# Patient Record
Sex: Male | Born: 1960 | Race: White | Hispanic: No | Marital: Married | Smoking: Never smoker
Health system: Southern US, Community
[De-identification: ages and names within clinical notes are randomized; demographics above are authoritative.]

## PROBLEM LIST (undated history)

## (undated) DIAGNOSIS — H9190 Unspecified hearing loss, unspecified ear: Secondary | ICD-10-CM

## (undated) DIAGNOSIS — I10 Essential (primary) hypertension: Secondary | ICD-10-CM

## (undated) DIAGNOSIS — E119 Type 2 diabetes mellitus without complications: Secondary | ICD-10-CM

## (undated) DIAGNOSIS — N183 Chronic kidney disease, stage 3 (moderate): Secondary | ICD-10-CM

## (undated) DIAGNOSIS — E8841 MELAS syndrome: Principal | ICD-10-CM

## (undated) DIAGNOSIS — I739 Peripheral vascular disease, unspecified: Secondary | ICD-10-CM

## (undated) HISTORY — PX: NASAL SINUS SURGERY: SHX719

## (undated) HISTORY — DX: Type 2 diabetes mellitus without complications: E11.9

## (undated) HISTORY — DX: MELAS syndrome: E88.41

## (undated) HISTORY — DX: Unspecified hearing loss, unspecified ear: H91.90

## (undated) HISTORY — DX: Essential (primary) hypertension: I10

## (undated) HISTORY — DX: Peripheral vascular disease, unspecified: I73.9

---

## 2018-02-23 ENCOUNTER — Encounter: Payer: Self-pay | Admitting: *Deleted

## 2018-02-23 ENCOUNTER — Encounter: Payer: Self-pay | Admitting: Cardiology

## 2018-02-23 DIAGNOSIS — E119 Type 2 diabetes mellitus without complications: Secondary | ICD-10-CM

## 2018-02-23 DIAGNOSIS — E8841 MELAS syndrome: Secondary | ICD-10-CM

## 2018-02-23 DIAGNOSIS — H9190 Unspecified hearing loss, unspecified ear: Secondary | ICD-10-CM

## 2018-02-23 DIAGNOSIS — I739 Peripheral vascular disease, unspecified: Secondary | ICD-10-CM

## 2018-02-23 DIAGNOSIS — I10 Essential (primary) hypertension: Secondary | ICD-10-CM

## 2018-02-23 HISTORY — DX: Type 2 diabetes mellitus without complications: E11.9

## 2018-02-23 HISTORY — DX: Peripheral vascular disease, unspecified: I73.9

## 2018-02-23 HISTORY — DX: MELAS syndrome: E88.41

## 2018-02-23 HISTORY — DX: Unspecified hearing loss, unspecified ear: H91.90

## 2018-02-23 HISTORY — DX: Essential (primary) hypertension: I10

## 2018-03-09 ENCOUNTER — Ambulatory Visit (INDEPENDENT_AMBULATORY_CARE_PROVIDER_SITE_OTHER): Payer: Self-pay | Admitting: Cardiology

## 2018-03-09 ENCOUNTER — Encounter: Payer: Self-pay | Admitting: Cardiology

## 2018-03-09 VITALS — BP 130/82 | HR 89 | Ht 66.5 in | Wt 133.8 lb

## 2018-03-09 DIAGNOSIS — E088 Diabetes mellitus due to underlying condition with unspecified complications: Secondary | ICD-10-CM

## 2018-03-09 DIAGNOSIS — I10 Essential (primary) hypertension: Secondary | ICD-10-CM

## 2018-03-09 DIAGNOSIS — I209 Angina pectoris, unspecified: Secondary | ICD-10-CM

## 2018-03-09 MED ORDER — ASPIRIN EC 325 MG PO TBEC: 325.0000 mg | DELAYED_RELEASE_TABLET | Freq: Every day | ORAL | 3 refills | Status: DC

## 2018-03-09 MED ORDER — NITROGLYCERIN 0.4 MG SL SUBL: 0.4000 mg | SUBLINGUAL_TABLET | SUBLINGUAL | 11 refills | Status: DC | PRN

## 2018-03-09 NOTE — Patient Instructions (Addendum)
Medication Instructions:  Your physician has recommended you make the following change in your medication:  START enteric coated aspirin 325 mg daily START Nitroglycerin 0.4 mg sublingual (under your tongue) as needed for chest pain. If experiencing chest pain, stop what you are doing and sit down. Take 1 nitroglycerin and wait 5 minutes. If chest pain continues, take another nitroglycerin and wait 5 minutes. If chest pain does not subside, take 1 more nitroglycerin and dial 911. You make take a total of 3 nitroglycerin in a 15 minute time frame.  Labwork: Your physician recommends that you have the following labs drawn: CBC and BMP  Testing/Procedures:   [facility] CARDIOVASCULAR DIVISION [facility] [place] [address] [place] [address] Dept: [phone] Loc: [phone]  Daniel Malone  03/09/2018  You are scheduled for a Cardiac Catheterization on Wednesday, May 22 with Dr. Lance Muss.  1. Please arrive at the [facility]Main Entrance [facility] at [facility]: [address] [place], [place] [address] at 9:30 AM (two hours before your procedure to ensure your preparation). Free valet parking service is available.   Special note: Every effort is made to have your procedure done on time. Please understand that emergencies sometimes delay scheduled procedures.  2. Diet: You may have a clear liquid breakfast the morning of the heart cath until 5 AM  3. Labs: Done today.  4. Medication instructions in preparation for your procedure:  Please hold your januvia, glipizide, and actos the morning of the heart cath.  On the morning of your procedure, take your Aspirin and any morning medicines NOT listed above.  You may use sips of water.  5. Plan for one night stay--bring personal belongings. 6. Bring a current list of your medications and current insurance cards. 7. You MUST have a responsible person to drive you  home. 8. Someone MUST be with you the first 24 hours after you arrive home or your discharge will be delayed. 9. Please wear clothes that are easy to get on and off and wear slip-on shoes.  Thank you for allowing [place] to care for you!   -- [facility]   Follow-Up: Your physician recommends that you schedule a follow-up appointment in: 1 month  Any Other Special Instructions Will Be Listed Below (If Applicable).     If you need a refill on your cardiac medications before your next appointment, please call your pharmacy.   [facility]  Garey Ham, RN, BSN    Aspirin and Your Heart Aspirin is a medicine that affects the way blood clots. Aspirin can be used to help reduce the risk of blood clots, heart attacks, and other heart-related problems. Should I take aspirin? Your health care provider will help you determine whether it is safe and beneficial for you to take aspirin daily. Taking aspirin daily may be beneficial if you:  Have had a heart attack or chest pain.  Have undergone open heart surgery such as coronary artery bypass surgery (CABG).  Have had coronary angioplasty.  Have experienced a stroke or transient ischemic attack (TIA).  Have peripheral vascular disease (PVD).  Have chronic heart rhythm problems such as atrial fibrillation.  Are there any risks of taking aspirin daily? Daily use of aspirin can increase your risk of side effects. Some of these include:  Bleeding. Bleeding problems can be minor or serious. An example of a minor problem is a cut that does  not stop bleeding. An example of a more serious problem is stomach bleeding or bleeding into the brain. Your risk of bleeding is increased if you are also taking non-steroidal anti-inflammatory medicine (NSAIDs).  Increased bruising.  Upset stomach.  An allergic reaction. People who have nasal polyps have an increased risk of developing an aspirin allergy.  What are  some guidelines I should follow when taking aspirin?  Take aspirin only as directed by your health care provider. Make sure you understand how much you should take and what form you should take. The two forms of aspirin are: ? Non-enteric-coated. This type of aspirin does not have a coating and is absorbed quickly. Non-enteric-coated aspirin is usually recommended for people with chest pain. This type of aspirin also comes in a chewable form. ? Enteric-coated. This type of aspirin has a special coating that releases the medicine very slowly. Enteric-coated aspirin causes less stomach upset than non-enteric-coated aspirin. This type of aspirin should not be chewed or crushed.  Drink alcohol in moderation. Drinking alcohol increases your risk of bleeding. When should I seek medical care?  You have unusual bleeding or bruising.  You have stomach pain.  You have an allergic reaction. Symptoms of an allergic reaction include: ? Hives. ? Itchy skin. ? Swelling of the lips, tongue, or face.  You have ringing in your ears. When should I seek immediate medical care?  Your bowel movements are bloody, dark red, or black in color.  You vomit or cough up blood.  You have blood in your urine.  You cough, wheeze, or feel short of breath. If you have any of the following symptoms, this is an emergency. Do not wait to see if the pain will go away. Get medical help at once. Call your local emergency services (911 in the [place] Do not drive yourself to the hospital.  You have severe chest pain, especially if the pain is crushing or pressure-like and spreads to the arms, back, neck, or jaw.  You have stroke-like symptoms, such as: ? Loss of vision. ? Difficulty talking. ? Numbness or weakness on one side of your body. ? Numbness or weakness in your arm or leg. ? Not thinking clearly or feeling confused.  This information is not intended to replace advice given to you by your health care provider.  Make sure you discuss any questions you have with your health care provider. Document Released: 09/18/2008 Document Revised: 02/13/2016 Document Reviewed: 01/11/2014 Elsevier Interactive Patient Education  2018 [employer].   Coronary Angiogram With Stent Coronary angiogram with stent placement is a procedure to widen or open a narrow blood vessel of the heart (coronary artery). Arteries may become blocked by cholesterol buildup (plaques) in the lining of the wall. When a coronary artery becomes partially blocked, blood flow to that area decreases. This may lead to chest pain or a heart attack (myocardial infarction). A stent is a small piece of metal that looks like mesh or a spring. Stent placement may be done as treatment for a heart attack or right after a coronary angiogram in which a blocked artery is found. Let your health care provider know about:  Any allergies you have.  All medicines you are taking, including vitamins, herbs, eye drops, creams, and over-the-counter medicines.  Any problems you or family members have had with anesthetic medicines.  Any blood disorders you have.  Any surgeries you have had.  Any medical conditions you have.  Whether you are pregnant or may be  pregnant. What are the risks? Generally, this is a safe procedure. However, problems may occur, including:  Damage to the heart or its blood vessels.  A return of blockage.  Bleeding, infection, or bruising at the insertion site.  A collection of blood under the skin (hematoma) at the insertion site.  A blood clot in another part of the body.  Kidney injury.  Allergic reaction to the dye or contrast that is used.  Bleeding into the abdomen (retroperitoneal bleeding).  What happens before the procedure? Staying hydrated Follow instructions from your health care provider about hydration, which may include:  Up to 2 hours before the procedure - you may continue to drink clear liquids, such  as water, clear fruit juice, black coffee, and plain tea.  Eating and drinking restrictions Follow instructions from your health care provider about eating and drinking, which may include:  8 hours before the procedure - stop eating heavy meals or foods such as meat, fried foods, or fatty foods.  6 hours before the procedure - stop eating light meals or foods, such as toast or cereal.  2 hours before the procedure - stop drinking clear liquids.  Ask your health care provider about:  Changing or stopping your regular medicines. This is especially important if you are taking diabetes medicines or blood thinners.  Taking medicines such as ibuprofen. These medicines can thin your blood. Do not take these medicines before your procedure if your health care provider instructs you not to. Generally, aspirin is recommended before a procedure of passing a small, thin tube (catheter) through a blood vessel and into the heart (cardiac catheterization).  What happens during the procedure?  An IV tube will be inserted into one of your veins.  You will be given one or more of the following: ? A medicine to help you relax (sedative). ? A medicine to numb the area where the catheter will be inserted into an artery (local anesthetic).  To reduce your risk of infection: ? Your health care team will wash or sanitize their hands. ? Your skin will be washed with soap. ? Hair may be removed from the area where the catheter will be inserted.  Using a guide wire, the catheter will be inserted into an artery. The location may be in your groin, in your wrist, or in the fold of your arm (near your elbow).  A type of X-ray (fluoroscopy) will be used to help guide the catheter to the opening of the arteries in the heart.  A dye will be injected into the catheter, and X-rays will be taken. The dye will help to show where any narrowing or blockages are located in the arteries.  A tiny wire will be guided to the  blocked spot, and a balloon will be inflated to make the artery wider.  The stent will be expanded and will crush the plaques into the wall of the vessel. The stent will hold the area open and improve the blood flow. Most stents have a drug coating to reduce the risk of the stent narrowing over time.  The artery may be made wider using a drill, laser, or other tools to remove plaques.  When the blood flow is better, the catheter will be removed. The lining of the artery will grow over the stent, which stays where it was placed. This procedure may vary among health care providers and hospitals. What happens after the procedure?  If the procedure is done through the leg, you will  be kept in bed lying flat for about 6 hours. You will be instructed to not bend and not cross your legs.  The insertion site will be checked frequently.  The pulse in your foot or wrist will be checked frequently.  You may have additional blood tests, X-rays, and a test that records the electrical activity of your heart (electrocardiogram, or ECG). This information is not intended to replace advice given to you by your health care provider. Make sure you discuss any questions you have with your health care provider. Document Released: 04/12/2003 Document Revised: 06/05/2016 Document Reviewed: 05/11/2016 Elsevier Interactive Patient Education  2018 [employer]. Nitroglycerin sublingual tablets What is this medicine? NITROGLYCERIN (nye troe GLI ser in) is a type of vasodilator. It relaxes blood vessels, increasing the blood and oxygen supply to your heart. This medicine is used to relieve chest pain caused by angina. It is also used to prevent chest pain before activities like climbing stairs, going outdoors in cold weather, or sexual activity. This medicine may be used for other purposes; ask your health care provider or pharmacist if you have questions. COMMON BRAND NAME(S): Nitroquick, Nitrostat, Nitrotab What  should I tell my health care provider before I take this medicine? They need to know if you have any of these conditions: -anemia -head injury, recent stroke, or bleeding in the brain -liver disease -previous heart attack -an unusual or allergic reaction to nitroglycerin, other medicines, foods, dyes, or preservatives -pregnant or trying to get pregnant -breast-feeding How should I use this medicine? Take this medicine by mouth as needed. At the first sign of an angina attack (chest pain or tightness) place one tablet under your tongue. You can also take this medicine 5 to 10 minutes before an event likely to produce chest pain. Follow the directions on the prescription label. Let the tablet dissolve under the tongue. Do not swallow whole. Replace the dose if you accidentally swallow it. It will help if your mouth is not dry. Saliva around the tablet will help it to dissolve more quickly. Do not eat or drink, smoke or chew tobacco while a tablet is dissolving. If you are not better within 5 minutes after taking ONE dose of nitroglycerin, call 9-1-1 immediately to seek emergency medical care. Do not take more than 3 nitroglycerin tablets over 15 minutes. If you take this medicine often to relieve symptoms of angina, your doctor or health care professional may provide you with different instructions to manage your symptoms. If symptoms do not go away after following these instructions, it is important to call 9-1-1 immediately. Do not take more than 3 nitroglycerin tablets over 15 minutes. Talk to your pediatrician regarding the use of this medicine in children. Special care may be needed. Overdosage: If you think you have taken too much of this medicine contact a poison control center or emergency room at once. NOTE: This medicine is only for you. Do not share this medicine with others. What if I miss a dose? This does not apply. This medicine is only used as needed. What may interact with this  medicine? Do not take this medicine with any of the following medications: -certain migraine medicines like ergotamine and dihydroergotamine (DHE) -medicines used to treat erectile dysfunction like sildenafil, tadalafil, and vardenafil -riociguat This medicine may also interact with the following medications: -alteplase -aspirin -heparin -medicines for high blood pressure -medicines for mental depression -other medicines used to treat angina -phenothiazines like chlorpromazine, mesoridazine, prochlorperazine, thioridazine This list may not  describe all possible interactions. Give your health care provider a list of all the medicines, herbs, non-prescription drugs, or dietary supplements you use. Also tell them if you smoke, drink alcohol, or use illegal drugs. Some items may interact with your medicine. What should I watch for while using this medicine? Tell your doctor or health care professional if you feel your medicine is no longer working. Keep this medicine with you at all times. Sit or lie down when you take your medicine to prevent falling if you feel dizzy or faint after using it. Try to remain calm. This will help you to feel better faster. If you feel dizzy, take several deep breaths and lie down with your feet propped up, or bend forward with your head resting between your knees. You may get drowsy or dizzy. Do not drive, use machinery, or do anything that needs mental alertness until you know how this drug affects you. Do not stand or sit up quickly, especially if you are an older patient. This reduces the risk of dizzy or fainting spells. Alcohol can make you more drowsy and dizzy. Avoid alcoholic drinks. Do not treat yourself for coughs, colds, or pain while you are taking this medicine without asking your doctor or health care professional for advice. Some ingredients may increase your blood pressure. What side effects may I notice from receiving this medicine? Side effects that  you should report to your doctor or health care professional as soon as possible: -blurred vision -dry mouth -skin rash -sweating -the feeling of extreme pressure in the head -unusually weak or tired Side effects that usually do not require medical attention (report to your doctor or health care professional if they continue or are bothersome): -flushing of the face or neck -headache -irregular heartbeat, palpitations -nausea, vomiting This list may not describe all possible side effects. Call your doctor for medical advice about side effects. You may report side effects to FDA at 1-800-FDA-1088. Where should I keep my medicine? Keep out of the reach of children. Store at room temperature between 20 and 25 degrees C (68 and 77 degrees F). Store in Retail buyer. Protect from light and moisture. Keep tightly closed. Throw away any unused medicine after the expiration date. NOTE: This sheet is a summary. It may not cover all possible information. If you have questions about this medicine, talk to your doctor, pharmacist, or health care provider.  2018 Elsevier/Gold Standard (2013-08-04 17:57:36)

## 2018-03-09 NOTE — Addendum Note (Signed)
Addended by: Craige Cotta on: 03/09/2018 09:38 AM   Modules accepted: Orders

## 2018-03-09 NOTE — H&P (View-Only) (Signed)
Cardiology Office Note:    Date:  03/09/2018   ID:  Daniel Malone, DOB 07/11/1961, MRN [id number]  PCP:  [name], [name], MD  Cardiologist:  Leasha Goldberger R Stephaun Million, MD   Referring MD: [name], [name], MD    ASSESSMENT:    1. Angina pectoris (HCC)   2. Essential hypertension   3. Diabetes mellitus due to underlying condition with complication, without long-term current use of insulin (HCC)    PLAN:    In order of problems listed above:  1. Primary prevention stressed with the patient.  Importance of compliance with diet and medications stressed and he vocalized understanding.  Diet was discussed for diabetes mellitus. 2. Sublingual nitroglycerin prescription was sent, its protocol and 911 protocol explained and the patient vocalized understanding questions were answered to the patient's satisfaction 3. He chews tobacco and I told him to be abstinent from any form of tobacco abuse and he vocalized understanding. 4. The patient's chest tightness symptoms are concerning and this is in addition to the fact that he has significant risk factors for coronary artery disease including diabetes mellitus.  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 5. In view of the patient's symptoms, I discussed with the patient options for evaluation. Invasive and noninvasive options were given to the patient. I discussed stress testing and coronary angiography and left heart catheterization at length. Benefits, pros and cons of each approach were discussed at length. Patient had multiple questions which were answered to the patient's satisfaction. Patient opted for invasive evaluation and we will set up for coronary angiography and left heart catheterization. Further recommendations will be made based on the findings with coronary angiography. In the interim if the patient has any significant symptoms in hospital to the nearest emergency room. 6. The patient was advised to take  full-strength coated aspirin on a daily basis. 7. He needs to be on statin therapy to as he is a diabetic and also with this coronary evaluation will help us manage his risks aggressively.  We will address this after the coronary angiography.   Medication Adjustments/Labs and Tests Ordered: Current medicines are reviewed at length with the patient today.  Concerns regarding medicines are outlined above.  No orders of the defined types were placed in this encounter.  No orders of the defined types were placed in this encounter.    History of Present Illness:    Daniel Malone is a 57 y.o. male who is being seen today for the evaluation of chest tightness at the request of [name], [name], MD.  Patient is a pleasant 57-year-old male.  He has past medical history of essential hypertension and diabetes mellitus.  He mentions to me that in the recent past several weeks he is noticing chest tightness upon exertion and this occurs consistently with exercise.  He does not exercise on a regular basis but his work involves physical activity.  He has noticed these chest tightness going to the neck into the arm also consistently with exertion.  No orthopnea PND or any syncope.  This is also accompanied by shortness of breath.  The patient is very concerned about the symptoms and is here for an evaluation.  At the time of my evaluation, the patient is alert awake oriented and in no distress.  Past Medical History:  Diagnosis Date  . Diabetes mellitus, type 2 (HCC) 02/23/2018  . Hearing loss 02/23/2018  . Hypertension 02/23/2018  . MELAS syndrome (HCC) 02/23/2018  .   Peripheral vascular disease (HCC) 02/23/2018    Past Surgical History:  Procedure Laterality Date  . NASAL SINUS SURGERY      Current Medications: Current Meds  Medication Sig  . glipiZIDE (GLUCOTROL XL) 10 MG 24 hr tablet Take 10 mg by mouth 2 (two) times daily.  Marland Kitchen lisinopril (PRINIVIL,ZESTRIL) 10 MG tablet Take 10 mg by mouth daily.  .  pioglitazone (ACTOS) 30 MG tablet Take 30 mg by mouth daily.  . sitaGLIPtin (JANUVIA) 100 MG tablet Take 100 mg by mouth daily.     Allergies:   Patient has no known allergies.   Social History   Socioeconomic History  . Marital status: Married    Spouse name: Not on file  . Number of children: 1  . Years of education: Not on file  . Highest education level: Not on file  Occupational History  . Not on file  Social Needs  . Financial resource strain: Not on file  . Food insecurity:    Worry: Not on file    Inability: Not on file  . Transportation needs:    Medical: Not on file    Non-medical: Not on file  Tobacco Use  . Smoking status: Never Smoker  . Smokeless tobacco: Current User    Types: Chew  Substance and Sexual Activity  . Alcohol use: Not Currently  . Drug use: Not Currently  . Sexual activity: Not on file  Lifestyle  . Physical activity:    Days per week: Not on file    Minutes per session: Not on file  . Stress: Not on file  Relationships  . Social connections:    Talks on phone: Not on file    Gets together: Not on file    Attends religious service: Not on file    Active member of club or organization: Not on file    Attends meetings of clubs or organizations: Not on file    Relationship status: Not on file  Other Topics Concern  . Not on file  Social History Narrative  . Not on file     Family History: The patient's family history includes CAD in his father; Diabetes in his mother.  ROS:   Please see the history of present illness.    All other systems reviewed and are negative.  EKGs/Labs/Other Studies Reviewed:    The following studies were reviewed today: EKG reveals sinus rhythm and nonspecific ST-T changes.   Recent Labs: No results found for requested labs within last 8760 hours.  Recent Lipid Panel No results found for: CHOL, TRIG, HDL, CHOLHDL, VLDL, LDLCALC, LDLDIRECT  Physical Exam:    VS:  BP 130/82 (BP Location: Left Arm,  Patient Position: Sitting, Cuff Size: Normal)   Pulse 89   Ht 5' 6.5" (1.689 m)   Wt 133 lb 12.8 oz (60.7 kg)   SpO2 99%   BMI 21.27 kg/m     Wt Readings from Last 3 Encounters:  03/09/18 133 lb 12.8 oz (60.7 kg)  02/23/18 133 lb (60.3 kg)     GEN: Patient is in no acute distress HEENT: Normal NECK: No JVD; No carotid bruits LYMPHATICS: No lymphadenopathy CARDIAC: S1 S2 regular, 2/6 systolic murmur at the apex. RESPIRATORY:  Clear to auscultation without rales, wheezing or rhonchi  ABDOMEN: Soft, non-tender, non-distended MUSCULOSKELETAL:  No edema; No deformity  SKIN: Warm and dry NEUROLOGIC:  Alert and oriented x 3 PSYCHIATRIC:  Normal affect    Signed, Garwin Brothers, MD  03/09/2018  9:[place]

## 2018-03-09 NOTE — Addendum Note (Signed)
Addended by: Craige Cotta on: 03/09/2018 09:37 AM   Modules accepted: Orders

## 2018-03-09 NOTE — Progress Notes (Signed)
Cardiology Office Note:    Date:  03/09/2018   ID:  Daniel Malone, DOB Apr 10, 1961, MRN [id number]  PCP:  Nonnie Done., MD  Cardiologist:  Garwin Brothers, MD   Referring MD: Nonnie Done., MD    ASSESSMENT:    1. Angina pectoris (HCC)   2. Essential hypertension   3. Diabetes mellitus due to underlying condition with complication, without long-term current use of insulin (HCC)    PLAN:    In order of problems listed above:  1. Primary prevention stressed with the patient.  Importance of compliance with diet and medications stressed and he vocalized understanding.  Diet was discussed for diabetes mellitus. 2. Sublingual nitroglycerin prescription was sent, its protocol and 911 protocol explained and the patient vocalized understanding questions were answered to the patient's satisfaction 3. He chews tobacco and I told him to be abstinent from any form of tobacco abuse and he vocalized understanding. 4. The patient's chest tightness symptoms are concerning and this is in addition to the fact that he has significant risk factors for coronary artery disease including diabetes mellitus.  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 5. In view of the patient's symptoms, I discussed with the patient options for evaluation. Invasive and noninvasive options were given to the patient. I discussed stress testing and coronary angiography and left heart catheterization at length. Benefits, pros and cons of each approach were discussed at length. Patient had multiple questions which were answered to the patient's satisfaction. Patient opted for invasive evaluation and we will set up for coronary angiography and left heart catheterization. Further recommendations will be made based on the findings with coronary angiography. In the interim if the patient has any significant symptoms in hospital to the nearest emergency room. 6. The patient was advised to take  full-strength coated aspirin on a daily basis. 7. He needs to be on statin therapy to as he is a diabetic and also with this coronary evaluation will help [place] manage his risks aggressively.  We will address this after the coronary angiography.   Medication Adjustments/Labs and Tests Ordered: Current medicines are reviewed at length with the patient today.  Concerns regarding medicines are outlined above.  No orders of the defined types were placed in this encounter.  No orders of the defined types were placed in this encounter.    History of Present Illness:    Daniel Malone is a 57 y.o. male who is being seen today for the evaluation of chest tightness at the request of [name], Excell Seltzer., MD.  Patient is a pleasant 57 year old male.  He has past medical history of essential hypertension and diabetes mellitus.  He mentions to me that in the recent past several weeks he is noticing chest tightness upon exertion and this occurs consistently with exercise.  He does not exercise on a regular basis but his work involves physical activity.  He has noticed these chest tightness going to the neck into the arm also consistently with exertion.  No orthopnea PND or any syncope.  This is also accompanied by shortness of breath.  The patient is very concerned about the symptoms and is here for an evaluation.  At the time of my evaluation, the patient is alert awake oriented and in no distress.  Past Medical History:  Diagnosis Date  . Diabetes mellitus, type 2 (HCC) 02/23/2018  . Hearing loss 02/23/2018  . Hypertension 02/23/2018  . MELAS syndrome (HCC) 02/23/2018  .  Peripheral vascular disease (HCC) 02/23/2018    Past Surgical History:  Procedure Laterality Date  . NASAL SINUS SURGERY      Current Medications: Current Meds  Medication Sig  . glipiZIDE (GLUCOTROL XL) 10 MG 24 hr tablet Take 10 mg by mouth 2 (two) times daily.  Marland Kitchen lisinopril (PRINIVIL,ZESTRIL) 10 MG tablet Take 10 mg by mouth daily.  .  pioglitazone (ACTOS) 30 MG tablet Take 30 mg by mouth daily.  . sitaGLIPtin (JANUVIA) 100 MG tablet Take 100 mg by mouth daily.     Allergies:   Patient has no known allergies.   Social History   Socioeconomic History  . Marital status: Married    Spouse name: Not on file  . Number of children: 1  . Years of education: Not on file  . Highest education level: Not on file  Occupational History  . Not on file  Social Needs  . Financial resource strain: Not on file  . Food insecurity:    Worry: Not on file    Inability: Not on file  . Transportation needs:    Medical: Not on file    Non-medical: Not on file  Tobacco Use  . Smoking status: Never Smoker  . Smokeless tobacco: Current User    Types: Chew  Substance and Sexual Activity  . Alcohol use: Not Currently  . Drug use: Not Currently  . Sexual activity: Not on file  Lifestyle  . Physical activity:    Days per week: Not on file    Minutes per session: Not on file  . Stress: Not on file  Relationships  . Social connections:    Talks on phone: Not on file    Gets together: Not on file    Attends religious service: Not on file    Active member of club or organization: Not on file    Attends meetings of clubs or organizations: Not on file    Relationship status: Not on file  Other Topics Concern  . Not on file  Social History Narrative  . Not on file     Family History: The patient's family history includes CAD in his father; Diabetes in his mother.  ROS:   Please see the history of present illness.    All other systems reviewed and are negative.  EKGs/Labs/Other Studies Reviewed:    The following studies were reviewed today: EKG reveals sinus rhythm and nonspecific ST-T changes.   Recent Labs: No results found for requested labs within last 8760 hours.  Recent Lipid Panel No results found for: CHOL, TRIG, HDL, CHOLHDL, VLDL, LDLCALC, LDLDIRECT  Physical Exam:    VS:  BP 130/82 (BP Location: Left Arm,  Patient Position: Sitting, Cuff Size: Normal)   Pulse 89   Ht 5' 6.5" (1.689 m)   Wt 133 lb 12.8 oz (60.7 kg)   SpO2 99%   BMI 21.27 kg/m     Wt Readings from Last 3 Encounters:  03/09/18 133 lb 12.8 oz (60.7 kg)  02/23/18 133 lb (60.3 kg)     GEN: Patient is in no acute distress HEENT: Normal NECK: No JVD; No carotid bruits LYMPHATICS: No lymphadenopathy CARDIAC: S1 S2 regular, 2/6 systolic murmur at the apex. RESPIRATORY:  Clear to auscultation without rales, wheezing or rhonchi  ABDOMEN: Soft, non-tender, non-distended MUSCULOSKELETAL:  No edema; No deformity  SKIN: Warm and dry NEUROLOGIC:  Alert and oriented x 3 PSYCHIATRIC:  Normal affect    Signed, Garwin Brothers, MD  03/09/2018  9:[place]

## 2018-03-10 ENCOUNTER — Encounter (HOSPITAL_COMMUNITY)
Admission: RE | Disposition: A | Discharge status: Home / Self Care | Payer: Self-pay | Source: Ambulatory Visit | Attending: Cardiothoracic Surgery

## 2018-03-10 ENCOUNTER — Inpatient Hospital Stay (HOSPITAL_COMMUNITY): Payer: Self-pay

## 2018-03-10 ENCOUNTER — Other Ambulatory Visit: Payer: Self-pay | Admitting: *Deleted

## 2018-03-10 ENCOUNTER — Inpatient Hospital Stay (HOSPITAL_COMMUNITY)
Admission: RE | Admit: 2018-03-10 | Discharge: 2018-03-17 | DRG: 234 | Disposition: A | Discharge status: Home / Self Care | Payer: Self-pay | Source: Ambulatory Visit | Attending: Cardiothoracic Surgery | Admitting: Cardiothoracic Surgery

## 2018-03-10 ENCOUNTER — Encounter (HOSPITAL_COMMUNITY): Payer: Self-pay | Admitting: Cardiothoracic Surgery

## 2018-03-10 ENCOUNTER — Other Ambulatory Visit: Payer: Self-pay

## 2018-03-10 DIAGNOSIS — Z833 Family history of diabetes mellitus: Secondary | ICD-10-CM

## 2018-03-10 DIAGNOSIS — Z7984 Long term (current) use of oral hypoglycemic drugs: Secondary | ICD-10-CM

## 2018-03-10 DIAGNOSIS — Z01810 Encounter for preprocedural cardiovascular examination: Secondary | ICD-10-CM

## 2018-03-10 DIAGNOSIS — H9190 Unspecified hearing loss, unspecified ear: Secondary | ICD-10-CM | POA: Diagnosis present

## 2018-03-10 DIAGNOSIS — I129 Hypertensive chronic kidney disease with stage 1 through stage 4 chronic kidney disease, or unspecified chronic kidney disease: Secondary | ICD-10-CM | POA: Diagnosis present

## 2018-03-10 DIAGNOSIS — I493 Ventricular premature depolarization: Secondary | ICD-10-CM | POA: Diagnosis not present

## 2018-03-10 DIAGNOSIS — E119 Type 2 diabetes mellitus without complications: Secondary | ICD-10-CM

## 2018-03-10 DIAGNOSIS — E088 Diabetes mellitus due to underlying condition with unspecified complications: Secondary | ICD-10-CM

## 2018-03-10 DIAGNOSIS — I2510 Atherosclerotic heart disease of native coronary artery without angina pectoris: Secondary | ICD-10-CM

## 2018-03-10 DIAGNOSIS — E8841 MELAS syndrome: Secondary | ICD-10-CM | POA: Diagnosis present

## 2018-03-10 DIAGNOSIS — R000 Tachycardia, unspecified: Secondary | ICD-10-CM | POA: Diagnosis present

## 2018-03-10 DIAGNOSIS — E875 Hyperkalemia: Secondary | ICD-10-CM | POA: Diagnosis present

## 2018-03-10 DIAGNOSIS — Z951 Presence of aortocoronary bypass graft: Secondary | ICD-10-CM

## 2018-03-10 DIAGNOSIS — R011 Cardiac murmur, unspecified: Secondary | ICD-10-CM | POA: Diagnosis present

## 2018-03-10 DIAGNOSIS — Z09 Encounter for follow-up examination after completed treatment for conditions other than malignant neoplasm: Secondary | ICD-10-CM

## 2018-03-10 DIAGNOSIS — N183 Chronic kidney disease, stage 3 unspecified: Secondary | ICD-10-CM

## 2018-03-10 DIAGNOSIS — D62 Acute posthemorrhagic anemia: Secondary | ICD-10-CM | POA: Diagnosis not present

## 2018-03-10 DIAGNOSIS — F17220 Nicotine dependence, chewing tobacco, uncomplicated: Secondary | ICD-10-CM | POA: Diagnosis present

## 2018-03-10 DIAGNOSIS — I10 Essential (primary) hypertension: Secondary | ICD-10-CM

## 2018-03-10 DIAGNOSIS — E1122 Type 2 diabetes mellitus with diabetic chronic kidney disease: Secondary | ICD-10-CM | POA: Diagnosis present

## 2018-03-10 DIAGNOSIS — Z823 Family history of stroke: Secondary | ICD-10-CM

## 2018-03-10 DIAGNOSIS — K5900 Constipation, unspecified: Secondary | ICD-10-CM | POA: Diagnosis not present

## 2018-03-10 DIAGNOSIS — I209 Angina pectoris, unspecified: Secondary | ICD-10-CM

## 2018-03-10 DIAGNOSIS — E785 Hyperlipidemia, unspecified: Secondary | ICD-10-CM | POA: Diagnosis present

## 2018-03-10 DIAGNOSIS — I25110 Atherosclerotic heart disease of native coronary artery with unstable angina pectoris: Secondary | ICD-10-CM

## 2018-03-10 DIAGNOSIS — E1151 Type 2 diabetes mellitus with diabetic peripheral angiopathy without gangrene: Secondary | ICD-10-CM | POA: Diagnosis present

## 2018-03-10 DIAGNOSIS — Z8249 Family history of ischemic heart disease and other diseases of the circulatory system: Secondary | ICD-10-CM

## 2018-03-10 DIAGNOSIS — Z79899 Other long term (current) drug therapy: Secondary | ICD-10-CM

## 2018-03-10 HISTORY — DX: Chronic kidney disease, stage 3 unspecified: N18.30

## 2018-03-10 HISTORY — PX: LEFT HEART CATH AND CORONARY ANGIOGRAPHY: CATH118249

## 2018-03-10 HISTORY — DX: Chronic kidney disease, stage 3 (moderate): N18.3

## 2018-03-10 LAB — URINALYSIS, ROUTINE W REFLEX MICROSCOPIC
Bilirubin Urine: NEGATIVE
Glucose, UA: 150 mg/dL — AB
Hgb urine dipstick: NEGATIVE
KETONES UR: NEGATIVE mg/dL
LEUKOCYTES UA: NEGATIVE
NITRITE: NEGATIVE
PROTEIN: NEGATIVE mg/dL
Specific Gravity, Urine: 1.011 (ref 1.005–1.030)
pH: 6 (ref 5.0–8.0)

## 2018-03-10 LAB — CBC WITH DIFFERENTIAL/PLATELET
BASOS ABS: 0 10*3/uL (ref 0.0–0.2)
Basos: 1 %
EOS (ABSOLUTE): 0.1 10*3/uL (ref 0.0–0.4)
Eos: 2 %
HEMOGLOBIN: 12.9 g/dL — AB (ref 13.0–17.7)
Hematocrit: 38.5 % (ref 37.5–51.0)
Immature Grans (Abs): 0 10*3/uL (ref 0.0–0.1)
Immature Granulocytes: 0 %
LYMPHS ABS: 1 10*3/uL (ref 0.7–3.1)
Lymphs: 19 %
MCH: 31.2 pg (ref 26.6–33.0)
MCHC: 33.5 g/dL (ref 31.5–35.7)
MCV: 93 fL (ref 79–97)
MONOCYTES: 11 %
Monocytes Absolute: 0.6 10*3/uL (ref 0.1–0.9)
NEUTROS PCT: 67 %
Neutrophils Absolute: 3.7 10*3/uL (ref 1.4–7.0)
PLATELETS: 165 10*3/uL (ref 150–450)
RBC: 4.14 x10E6/uL (ref 4.14–5.80)
RDW: 14.4 % (ref 12.3–15.4)
WBC: 5.5 10*3/uL (ref 3.4–10.8)

## 2018-03-10 LAB — GLUCOSE, CAPILLARY
GLUCOSE-CAPILLARY: 101 mg/dL — AB (ref 65–99)
GLUCOSE-CAPILLARY: 82 mg/dL (ref 65–99)
GLUCOSE-CAPILLARY: 127 mg/dL — AB (ref 65–99)
Glucose-Capillary: 153 mg/dL — ABNORMAL HIGH (ref 65–99)

## 2018-03-10 LAB — BASIC METABOLIC PANEL
BUN / CREAT RATIO: 16 (ref 9–20)
BUN: 25 mg/dL — ABNORMAL HIGH (ref 6–24)
CHLORIDE: 104 mmol/L (ref 96–106)
CO2: 21 mmol/L (ref 20–29)
Calcium: 9.2 mg/dL (ref 8.7–10.2)
Creatinine, Ser: 1.56 mg/dL — ABNORMAL HIGH (ref 0.76–1.27)
GFR calc Af Amer: 56 mL/min/{1.73_m2} — ABNORMAL LOW (ref >59)
GFR calc non Af Amer: 49 mL/min/{1.73_m2} — ABNORMAL LOW (ref >59)
GLUCOSE: 167 mg/dL — AB (ref 65–99)
POTASSIUM: 5.3 mmol/L — AB (ref 3.5–5.2)
SODIUM: 137 mmol/L (ref 134–144)

## 2018-03-10 LAB — PULMONARY FUNCTION TEST
FEF 25-75 Pre: 3.14 L/sec
FEF2575-%Pred-Pre: 112 %
FEV1-%Pred-Pre: 97 %
FEV1-Pre: 3.18 L
FEV1FVC-%Pred-Pre: 105 %
FEV6-%Pred-Pre: 96 %
FEV6-Pre: 3.93 L
FEV6FVC-%Pred-Pre: 103 %
FVC-%Pred-Pre: 92 %
FVC-Pre: 3.97 L
Pre FEV1/FVC ratio: 80 %
Pre FEV6/FVC Ratio: 99 %

## 2018-03-10 LAB — SURGICAL PCR SCREEN
MRSA, PCR: NEGATIVE
STAPHYLOCOCCUS AUREUS: NEGATIVE

## 2018-03-10 LAB — LACTIC ACID, PLASMA: Lactic Acid, Venous: 1.9 mmol/L (ref 0.5–1.9)

## 2018-03-10 SURGERY — LEFT HEART CATH AND CORONARY ANGIOGRAPHY
Anesthesia: LOCAL

## 2018-03-10 MED ORDER — ASPIRIN 81 MG PO CHEW
81.0000 mg | CHEWABLE_TABLET | Freq: Every day | ORAL | Status: DC
  Administered 2018-03-11: 81 mg via ORAL
  Filled 2018-03-10: qty 1

## 2018-03-10 MED ORDER — SODIUM CHLORIDE 0.9% FLUSH: 3.0000 mL | INTRAVENOUS | Status: DC | PRN

## 2018-03-10 MED ORDER — SODIUM CHLORIDE 0.9 % IV SOLN: INTRAVENOUS | Status: AC

## 2018-03-10 MED ORDER — NITROGLYCERIN 0.4 MG SL SUBL: 0.4000 mg | SUBLINGUAL_TABLET | SUBLINGUAL | Status: DC | PRN

## 2018-03-10 MED ORDER — LIDOCAINE HCL (PF) 1 % IJ SOLN
INTRAMUSCULAR | Status: DC | PRN
  Administered 2018-03-10: 2 mL

## 2018-03-10 MED ORDER — HEPARIN (PORCINE) IN NACL 2-0.9 UNITS/ML
INTRAMUSCULAR | Status: AC | PRN
  Administered 2018-03-10 (×2): 500 mL

## 2018-03-10 MED ORDER — NITROGLYCERIN 1 MG/10 ML FOR IR/CATH LAB
INTRA_ARTERIAL | Status: DC | PRN
  Administered 2018-03-10: 200 ug via INTRACORONARY

## 2018-03-10 MED ORDER — GLIPIZIDE ER 10 MG PO TB24
20.0000 mg | ORAL_TABLET | Freq: Every day | ORAL | Status: DC
  Administered 2018-03-11: 20 mg via ORAL
  Filled 2018-03-10: qty 2

## 2018-03-10 MED ORDER — FENTANYL CITRATE (PF) 100 MCG/2ML IJ SOLN
INTRAMUSCULAR | Status: DC | PRN
  Administered 2018-03-10: 25 ug via INTRAVENOUS

## 2018-03-10 MED ORDER — SODIUM CHLORIDE 0.9% FLUSH
3.0000 mL | Freq: Two times a day (BID) | INTRAVENOUS | Status: DC
  Administered 2018-03-10 – 2018-03-11 (×3): 3 mL via INTRAVENOUS

## 2018-03-10 MED ORDER — MIDAZOLAM HCL 2 MG/2ML IJ SOLN
INTRAMUSCULAR | Status: DC | PRN
  Administered 2018-03-10: 2 mg via INTRAVENOUS

## 2018-03-10 MED ORDER — SODIUM CHLORIDE 0.9 % IV SOLN: 250.0000 mL | INTRAVENOUS | Status: DC | PRN

## 2018-03-10 MED ORDER — FENTANYL CITRATE (PF) 100 MCG/2ML IJ SOLN
INTRAMUSCULAR | Status: AC
Start: 2018-03-10 — End: ?
  Filled 2018-03-10: qty 2

## 2018-03-10 MED ORDER — MIDAZOLAM HCL 2 MG/2ML IJ SOLN
INTRAMUSCULAR | Status: AC
  Filled 2018-03-10: qty 2

## 2018-03-10 MED ORDER — ACETAMINOPHEN 325 MG PO TABS: 650.0000 mg | ORAL_TABLET | ORAL | Status: DC | PRN

## 2018-03-10 MED ORDER — ASPIRIN 81 MG PO CHEW: 81.0000 mg | CHEWABLE_TABLET | ORAL | Status: DC

## 2018-03-10 MED ORDER — IOHEXOL 350 MG/ML SOLN
INTRAVENOUS | Status: DC | PRN
  Administered 2018-03-10: 45 mL via INTRAVENOUS

## 2018-03-10 MED ORDER — HEPARIN SODIUM (PORCINE) 1000 UNIT/ML IJ SOLN
INTRAMUSCULAR | Status: DC | PRN
  Administered 2018-03-10: 3000 [IU] via INTRAVENOUS

## 2018-03-10 MED ORDER — NITROGLYCERIN 1 MG/10 ML FOR IR/CATH LAB
INTRA_ARTERIAL | Status: AC
  Filled 2018-03-10: qty 10

## 2018-03-10 MED ORDER — HEPARIN (PORCINE) IN NACL 1000-0.9 UT/500ML-% IV SOLN
INTRAVENOUS | Status: AC
  Filled 2018-03-10: qty 500

## 2018-03-10 MED ORDER — LINAGLIPTIN 5 MG PO TABS
5.0000 mg | ORAL_TABLET | Freq: Every day | ORAL | Status: DC
  Administered 2018-03-11: 5 mg via ORAL
  Filled 2018-03-10: qty 1

## 2018-03-10 MED ORDER — SODIUM CHLORIDE 0.9 % IV SOLN
250.0000 mL | INTRAVENOUS | Status: DC | PRN
  Administered 2018-03-12: 08:00:00 via INTRAVENOUS

## 2018-03-10 MED ORDER — SODIUM CHLORIDE 0.9 % WEIGHT BASED INFUSION: 1.0000 mL/kg/h | INTRAVENOUS | Status: DC

## 2018-03-10 MED ORDER — PIOGLITAZONE HCL 30 MG PO TABS
30.0000 mg | ORAL_TABLET | Freq: Every day | ORAL | Status: DC
  Administered 2018-03-11: 30 mg via ORAL
  Filled 2018-03-10 (×3): qty 1

## 2018-03-10 MED ORDER — ONDANSETRON HCL 4 MG/2ML IJ SOLN: 4.0000 mg | Freq: Four times a day (QID) | INTRAMUSCULAR | Status: DC | PRN

## 2018-03-10 MED ORDER — HEPARIN (PORCINE) IN NACL 2-0.9 UNITS/ML
INTRAMUSCULAR | Status: DC | PRN
  Administered 2018-03-10: 10 mL via INTRA_ARTERIAL

## 2018-03-10 MED ORDER — SODIUM CHLORIDE 0.9 % WEIGHT BASED INFUSION
3.0000 mL/kg/h | INTRAVENOUS | Status: DC
  Administered 2018-03-10: 3 mL/kg/h via INTRAVENOUS

## 2018-03-10 MED ORDER — VERAPAMIL HCL 2.5 MG/ML IV SOLN
INTRAVENOUS | Status: AC
  Filled 2018-03-10: qty 2

## 2018-03-10 MED ORDER — SODIUM CHLORIDE 0.9% FLUSH: 3.0000 mL | Freq: Two times a day (BID) | INTRAVENOUS | Status: DC

## 2018-03-10 MED ORDER — ATORVASTATIN CALCIUM 80 MG PO TABS
80.0000 mg | ORAL_TABLET | Freq: Every day | ORAL | Status: DC
  Administered 2018-03-10: 80 mg via ORAL
  Filled 2018-03-10: qty 1

## 2018-03-10 SURGICAL SUPPLY — 10 items
CATH IMPULSE 5F ANG/FL3.5 (CATHETERS) ×2 IMPLANT
DEVICE RAD COMP TR BAND LRG (VASCULAR PRODUCTS) ×2 IMPLANT
GUIDEWIRE INQWIRE 1.5J.035X260 (WIRE) ×1 IMPLANT
INQWIRE 1.5J .035X260CM (WIRE) ×2
KIT HEART LEFT (KITS) ×2 IMPLANT
NEEDLE PERC 21GX4CM (NEEDLE) ×2 IMPLANT
PACK CARDIAC CATHETERIZATION (CUSTOM PROCEDURE TRAY) ×2 IMPLANT
SHEATH RAIN RADIAL 21G 6FR (SHEATH) ×2 IMPLANT
TRANSDUCER W/STOPCOCK (MISCELLANEOUS) ×2 IMPLANT
TUBING CIL FLEX 10 FLL-RA (TUBING) ×2 IMPLANT

## 2018-03-10 NOTE — Interval H&P Note (Signed)
Cath Lab Visit (complete for each Cath Lab visit)  Clinical Evaluation Leading to the Procedure:   ACS: No.  Non-ACS:    Anginal Classification: CCS III  Anti-ischemic medical therapy: Minimal Therapy (1 class of medications)  Non-Invasive Test Results: No non-invasive testing performed  Prior CABG: No previous CABG      History and Physical Interval Note:  03/10/2018 8:58 AM  Daniel Malone  has presented today for surgery, with the diagnosis of angina  The various methods of treatment have been discussed with the patient and family. After consideration of risks, benefits and other options for treatment, the patient has consented to  Procedure(s): LEFT HEART CATH AND CORONARY ANGIOGRAPHY (N/A) as a surgical intervention .  The patient's history has been reviewed, patient examined, no change in status, stable for surgery.  I have reviewed the patient's chart and labs.  Questions were answered to the patient's satisfaction.     Lance Muss

## 2018-03-10 NOTE — Progress Notes (Signed)
Right radial TR band removed at 1235, tegaderm and gauze applied, site is level 0.

## 2018-03-10 NOTE — Progress Notes (Signed)
[address]       [place] [address]             [phone]        [facility] Medical Record #[id number] Date of Birth: 07/12/1961  Referring: Dr Eldridge Dace Primary Care: Nonnie Done., MD Primary Cardiologist:Rajan Jones Broom, MD  Chief Complaint:   Chest pain   History of Present Illness:      Amarrion Pastorino is a 57 y.o. male who was seen yesterday in the cardiology office by Dr. Tomie China.  He was seen for for the evaluation of chest tightness at the request of [name], Excell Seltzer., MD.   patient has a past medical history of essential hypertension and diabetes mellitus.  He told Dr. Tomie China "that in the recent past several weeks he is noticing chest tightness upon exertion and this occurs consistently with exercise.  He does not exercise on a regular basis but his work involves physical activity.  He has noticed these chest tightness going to the neck into the arm also consistently with exertion.  No orthopnea PND or any syncope.  This is also accompanied by shortness of breath".    Because of the symptoms he described in his risk factors he was referred by Dr. Tomie China for cardiac catheterization today by Dr. Eldridge Dace.  There is a anatomic findings cardiac surgery consultation was requested.  Patient has family history of MELAS-   mitochondrial encephalomyopathy, lactic acidosis, and stroke-like episodes   Current Activity/ Functional Status: Patient is independent with mobility/ambulation, transfers, ADL's, IADL's.   Zubrod Score: At the time of surgery this patient's most appropriate activity status/level should be described as:     0    Normal activity, no symptoms     1    Restricted in physical strenuous activity but ambulatory, able to do out light work     2    Ambulatory and capable of self care, unable to do work activities, up and about                 more than 50%  Of the time                                3    Only limited  self care, in bed greater than 50% of waking hours     4    Completely disabled, no self care, confined to bed or chair     5    Moribund  Past Medical History:  Diagnosis Date  . Chronic renal disease, stage 3, moderately decreased glomerular filtration rate (GFR) between 30-59 mL/min/1.73 square meter (HCC) 03/10/2018  . Diabetes mellitus, type 2 (HCC) 02/23/2018  . Hearing loss 02/23/2018  . Hypertension 02/23/2018  . MELAS syndrome (HCC) 02/23/2018  . Peripheral vascular disease (HCC) 02/23/2018    Past Surgical History:  Procedure Laterality Date  . NASAL SINUS SURGERY      Social History   Tobacco Use  Smoking Status Never Smoker  Smokeless Tobacco Current User  . Types: Chew    Social History   Substance and Sexual Activity  Alcohol Use Not Currently   Family History: Patient's family history is significant for MELAS syndrome.  One sister died at age 41 with strokes was very small stature developed renal failure.  One sister has been tested.  The patient himself has not been.  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 0.9 %  sodium chloride infusion  250 mL Intravenous PRN Revankar, Rajan R, MD      . 0.9 %  sodium chloride infusion   Intravenous Continuous Corky Crafts, MD 100 mL/hr at 03/10/18 1039    . 0.9% sodium chloride infusion  1 mL/kg/hr Intravenous Continuous Revankar, Aundra Dubin, MD 60.7 mL/hr at 03/10/18 0949 1 mL/kg/hr at 03/10/18 0949  . [START ON 03/11/2018] aspirin chewable tablet 81 mg  81 mg Oral Pre-Cath Revankar, Rajan R, MD      . sodium chloride flush (NS) 0.9 % injection 3 mL  3 mL Intravenous Q12H Revankar, Rajan R, MD      . sodium chloride flush (NS) 0.9 % injection 3 mL  3 mL Intravenous PRN Revankar, Aundra Dubin, MD        Medications  Prior to Admission  Medication Sig Dispense Refill Last Dose  . aspirin EC 325 MG tablet Take 1 tablet (325 mg total) by mouth daily. 90 tablet 3 03/10/2018 at 0530  . glipiZIDE (GLUCOTROL XL) 10 MG 24 hr tablet Take 10 mg by mouth 2 (two) times daily.   03/09/2018 at Unknown time  . lisinopril (PRINIVIL,ZESTRIL) 10 MG tablet Take 10 mg by mouth daily.   03/10/2018 at 0530  . nitroGLYCERIN (NITROSTAT) 0.4 MG SL tablet Place 1 tablet (0.4 mg total) under the tongue every 5 (five) minutes as needed. 25 tablet 11 never  . pioglitazone (ACTOS) 30 MG tablet Take 30 mg by mouth every morning.    03/09/2018 at Unknown time  . sitaGLIPtin (JANUVIA) 100 MG tablet Take 100 mg by mouth every morning.    03/09/2018 at Unknown time    Family History  Problem Relation Age of Onset  . Diabetes Mother   . CAD Father      Review of Systems:      Cardiac Review of Systems: Y or  [    ]= no  Chest Pain [ y   ]  Resting SOB [ y  ] Exertional SOB  [ y ]  Pollyann [place].Etienne  ]   Pedal Edema [n   ]    Palpitations [n  ] Syncope  [  n]   Presyncope [ n  ]  General Review of Systems: [Y] = yes [  ]=no Constitional: recent weight change [  ]; anorexia [  ]; fatigue [  ]; nausea [  ]; night sweats [  ]; fever [  ]; or chills [  ]                                Dental: Last Dentist visit:   Eye : blurred vision [  ]; diplopia [   ]; vision changes [  ];  Amaurosis fugax[  ]; Resp: cough [  ];  wheezing[  ];  hemoptysis[  ]; shortness of breath[ y ]; paroxysmal nocturnal dyspnea[  ]; dyspnea on exertion[y  ]; or orthopnea[  ];  GI:  gallstones[  ], vomiting[  ];  dysphagia[  ]; melena[  ];  hematochezia [  ]; heartburn[  ];   Hx of  Colonoscopy[  ]; GU: kidney stones [  ]; hematuria[  ];  dysuria [  ];  nocturia[  ];  history of     obstruction [  ]; urinary frequency [  ]             Skin: rash, swelling[  ];, hair loss[  ];  peripheral edema[  ];  or itching[  ]; Musculosketetal: myalgias[  ];  joint swelling[  ];  joint  erythema[  ];  joint pain[  ];  back pain[  ];  Heme/Lymph: bruising[  ];  bleeding[  ];  anemia[  ];  Neuro: TIA[  ];  headaches[  ];  stroke[  ];  vertigo[  ];  seizures[  ];   paresthesias[ y ];  difficulty walking[  ];  Psych:depression[  ]; anxiety[  ];  Endocrine: diabetes[ y ];  thyroid dysfunction[  ];            Physical Exam: BP 135/74   Pulse (!) 0   Temp 97.8 F (36.6 C) (Oral)   Resp 12   Ht 5' 6.5" (1.689 m)   Wt 133 lb (60.3 kg)   SpO2 100%   BMI 21.15 kg/m    General appearance: alert, cooperative, appears stated age and no distress Head: Normocephalic, without obvious abnormality, atraumatic Neck: no adenopathy, no carotid bruit, no JVD, supple, symmetrical, trachea midline and thyroid not enlarged, symmetric, no tenderness/mass/nodules Lymph nodes: Cervical, supraclavicular, and axillary nodes normal. Resp: clear to auscultation bilaterally Back: symmetric, no curvature. ROM normal. No CVA tenderness. Cardio: regular rate and rhythm, S1, S2 normal, no murmur, click, rub or gallop GI: soft, non-tender; bowel sounds normal; no masses,  no organomegaly Extremities: extremities normal, atraumatic, no cyanosis or edema and Patient has palpable DP and PT pulses bilaterally, appears to have adequate vein for bypass and lower extremity, Neurologic: Grossly normal  Diagnostic Studies & Laboratory data:     Recent Radiology Findings:   No results found.   I have independently reviewed the above radiologic studies and discussed with the patient   CATH: Procedures   LEFT HEART CATH AND CORONARY ANGIOGRAPHY  Conclusion     Ost Ramus to Ramus lesion is 80% stenosed.  Ost Cx to Prox Cx lesion is 80% stenosed.  Mid Cx lesion is 50% stenosed.  Prox RCA lesion is 25% stenosed.  Ost LAD to Prox LAD lesion is 75% stenosed.  Ost 1st Diag lesion is 80% stenosed.  Mid LAD lesion is 25% stenosed.  The left ventricular systolic function is normal.  LV end  diastolic pressure is normal.  The left ventricular ejection fraction is 55-65% by visual estimate.  There is no aortic valve stenosis.   Severe disease in the proximal LAD, ramus and circumflex.  Long, diffuse lesions which did not respond to IC NTG.  Plan cardiac surgery consult for CABG.    Patient had some mild pain in the chest last night while at rest.  He has essentially left main equivalent disease.  Will admit for inpatient consultation.  Consider IV heparin as well 8 hours after sheath has been removed.    Coronary Findings   Diagnostic  Dominance: Right  Left Anterior Descending  Ost LAD to Prox LAD lesion 75% stenosed  Ost LAD to Prox LAD lesion is 75% stenosed.  Mid LAD lesion 25% stenosed  Mid LAD lesion is 25% stenosed.  First Diagonal Branch  Ost 1st Diag lesion 80% stenosed  Ost 1st Diag lesion is 80% stenosed.  Ramus Intermedius  Ost Ramus to Ramus lesion  80% stenosed  Ost Ramus to Ramus lesion is 80% stenosed.  Left Circumflex  Ost Cx to Prox Cx lesion 80% stenosed  Ost Cx to Prox Cx lesion is 80% stenosed.  Mid Cx lesion 50% stenosed  Mid Cx lesion is 50% stenosed.  First Obtuse Marginal Branch  Vessel is small in size.  Right Coronary Artery  Prox RCA lesion 25% stenosed  Prox RCA lesion is 25% stenosed.  Intervention   No interventions have been documented.  Wall Motion   Resting               Left Heart   Left Ventricle The left ventricular size is normal. The left ventricular systolic function is normal. LV end diastolic pressure is normal. The left ventricular ejection fraction is 55-65% by visual estimate. No regional wall motion abnormalities.  Aortic Valve There is no aortic valve stenosis.  Coronary Diagrams   Diagnostic Diagram         Most Recent Value  LV Systolic Pressure 113 mmHg  LV Diastolic Pressure 3 mmHg  LV EDP 10 mmHg  Arterial Occlusion Pressure Extended Systolic Pressure 100 mmHg  Arterial Occlusion  Pressure Extended Diastolic Pressure 62 mmHg  Arterial Occlusion Pressure Extended Mean Pressure 80 mmHg  Left Ventricular Apex Extended Systolic Pressure 104 mmHg  Left Ventricular Apex Extended Diastolic Pressure 5 mmHg  Left Ventricular Apex Extended EDP Pressure 9 mmHg     Recent Lab Findings: Lab Results  Component Value Date   WBC 5.5 03/09/2018   HGB 12.9 (L) 03/09/2018   HCT 38.5 03/09/2018   PLT 165 03/09/2018   GLUCOSE 167 (H) 03/09/2018   NA 137 03/09/2018   K 5.3 (H) 03/09/2018   CL 104 03/09/2018   CREATININE 1.56 (H) 03/09/2018   BUN 25 (H) 03/09/2018   CO2 21 03/09/2018   Chronic Kidney Disease   Stage I     GFR >90  Stage II    GFR 60-89  Stage IIIA GFR 45-59  Stage IIIB GFR 30-44  Stage IV   GFR 15-29  Stage V    GFR  <15  Lab Results  Component Value Date   CREATININE 1.56 (H) 03/09/2018   Estimated Creatinine Clearance: 44.6 mL/min (A) (by C-G formula based on SCr of 1.56 mg/dL (H)).    Assessment / Plan:   Unstable angina with significant coronary artery disease with high-grade stenoses equivalent to left main disease not suitable for angioplasty Stage IIIB GFR 30-44 Diabetes mellitus Hearing loss Tobacco use in the form of chewing, never a smoker Needs chest xray preop Family History of MELAS syndrome - will have pharmacy to consult on drug to avoid perioperative  Mitochondrial encephalomyopathy, lactic acidosis, and stroke-like episodes (MELAS) affects many parts of the body, particularly the brain and nervous system (encephalo-) and muscles (myopathy). Symptoms typically begin in childhood and may include muscle weakness and pain, recurrent headaches, loss of appetite, vomiting, and seizures. Most affected individuals experience stroke-like episodes beginning before age 50. People with MELAS can also have a buildup of lactic acid in their bodies that can lead to vomiting, abdominal pain, fatigue, muscle weakness, and difficulty breathing. The  genes associated with MELAS are located in mitochondrial DNA and therefore follow a maternal inheritance pattern (also called mitochondrial inheritance). MELAS can be inherited from the mother only, because only females pass mitochondrial DNA to their children. In some cases, MELAS results from a new mutation that was not inherited from a person's mother.  We will plan on checking carotid Dopplers, echocardiogram, lactic acid levels, and follow-up renal function and if remains stable consider coronary artery bypass grafting on Friday.  Risks and options been discussed with the patient and his family in detail.    [name] MD      [address] [place] [place] [address] Office [phone]   Beeper [phone]  03/10/2018 12:46 PM     Patient ID: Braulio Kiedrowski, male   DOB: 01/26/1961, 57 y.o.   MRN: [id number]

## 2018-03-10 NOTE — Progress Notes (Addendum)
Pharmacy was consulted to research medications to avoid with MELAS Syndrome.     Description MELAS: Mitochondrial encephalomyopathy, lactic acidosis, and stroke-like episodes (MELAS) syndrome. -This is an inherited mitochondrial disorder. Some medications (ex., anticonvulsants such as valproic acid), may further impair mitochondrial function or have potential for mitochondrial toxicity (ex., antibiotics such as aminoglycosides and linezolid). This syndrome may also worsen side effects of some medications such as 1)  lactic acidosis with metformin and topiramate, 2) hearing loss with aminoglycosides, and 3) myopathy with statins.   1) The follow resources were found with recommendations as following:  Patient care standards for primary mitochondrial disease: a consensus statement from the Mitochondrial Medicine Society  Medications that should avoided in patients with mitochondrial disease when possible and, if given, they should be used with caution: -valproic acid; statins; metformin; high-dose acetaminophen; and selected antibiotics, including aminoglycosides, linezolid, tetracycline, azithromycin, and erythromycin -Other medications are listed as "may be used with caution" and include: antiretrovirals (ex. HIV medications), topiramate, statins, gabapentin, zonisamide, phenobarbital, ethosuximide, carbamezepine and vigabatrin  Mitochondrial Medicine Society: (Consensus recommendations for anesthesia)  **This is not a complete list of recommendations around anesthesia. The following list is specific to medication use only  -Patients with mitochondrial diseases are at an increased risk of anesthesia-related complications.  - Caution must be used with volatile anesthetics because mitochondrial patients may potentially be hypersensitive. -Caution must be used with muscle relaxants in those mitochondrial patients with a preexisting myopathy or decreased respiratory drive. - Mitochondrial patients  may be at a higher risk for propofol infusion syndrome and propofol use should be avoided or limited to short procedures. -Consider slow titration and adjustment of volatile and parenteral anesthetics to minimize hemodynamic changes in mitochondrial patients. - Local anesthetics are generally well-tolerated in patients with mitochondrial defects.   Summary for medication usage with MELAS syndrome -I have contacted Anesthesia and was asked to e-mail recommendations which has been done  General medications  -A number of sources suggest that valproic acid should be avoided in the treatment of seizure -Medications that should be avoided or if needed use with caution: statins; metformin; high-dose acetaminophen; and selected antibiotics, including aminoglycosides, linezolid, tetracycline, azithromycin, and erythromycin   Anesthesia -Caution with: volatile anesthetics, limit propofol to short procedures -Slow titration and adjustment of volatile and parenteral anesthetics   References El-Hattab AW, Adesina AM, Brooke Dare, Scaglia F. MELAS syndrome: Clinical manifestations, pathogenesis, and treatment options. Mol Genet Metab. 2015 Sep-Oct;116(1-2):4-12  Cleophus Molt MK, et al. Diagnosis and management of mitochondrial disease: a consensus statement from the Mitochondrial Medicine Society. Genet Med. 2015 Sep;17(9):689-701   Patient care standards for primary mitochondrial disease: a consensus statement from the Mitochondrial Medicine Society. Genet Med. 2017 ZOX;09(60)  Harland German, PharmD Clinical Pharmacist 03/10/2018 6:48 PM

## 2018-03-11 ENCOUNTER — Inpatient Hospital Stay (HOSPITAL_COMMUNITY): Payer: Self-pay

## 2018-03-11 ENCOUNTER — Encounter (HOSPITAL_COMMUNITY): Payer: Self-pay | Admitting: Interventional Cardiology

## 2018-03-11 ENCOUNTER — Encounter (HOSPITAL_COMMUNITY): Payer: Self-pay

## 2018-03-11 DIAGNOSIS — I25110 Atherosclerotic heart disease of native coronary artery with unstable angina pectoris: Secondary | ICD-10-CM

## 2018-03-11 DIAGNOSIS — I200 Unstable angina: Secondary | ICD-10-CM

## 2018-03-11 DIAGNOSIS — Z01810 Encounter for preprocedural cardiovascular examination: Secondary | ICD-10-CM

## 2018-03-11 LAB — CBC
HCT: 39.1 % (ref 39.0–52.0)
Hemoglobin: 12.8 g/dL — ABNORMAL LOW (ref 13.0–17.0)
MCH: 30.7 pg (ref 26.0–34.0)
MCHC: 32.7 g/dL (ref 30.0–36.0)
MCV: 93.8 fL (ref 78.0–100.0)
Platelets: 149 10*3/uL — ABNORMAL LOW (ref 150–400)
RBC: 4.17 MIL/uL — ABNORMAL LOW (ref 4.22–5.81)
RDW: 13 % (ref 11.5–15.5)
WBC: 6.1 10*3/uL (ref 4.0–10.5)

## 2018-03-11 LAB — COMPREHENSIVE METABOLIC PANEL
ALT: 16 U/L — ABNORMAL LOW (ref 17–63)
AST: 14 U/L — ABNORMAL LOW (ref 15–41)
Albumin: 3.5 g/dL (ref 3.5–5.0)
Alkaline Phosphatase: 52 U/L (ref 38–126)
Anion gap: 8 (ref 5–15)
BUN: 18 mg/dL (ref 6–20)
CO2: 23 mmol/L (ref 22–32)
Calcium: 9 mg/dL (ref 8.9–10.3)
Chloride: 109 mmol/L (ref 101–111)
Creatinine, Ser: 1.54 mg/dL — ABNORMAL HIGH (ref 0.61–1.24)
GFR calc Af Amer: 56 mL/min — ABNORMAL LOW (ref >60)
GFR calc non Af Amer: 48 mL/min — ABNORMAL LOW (ref >60)
Glucose, Bld: 83 mg/dL (ref 65–99)
Potassium: 4.9 mmol/L (ref 3.5–5.1)
Sodium: 140 mmol/L (ref 135–145)
Total Bilirubin: 0.8 mg/dL (ref 0.3–1.2)
Total Protein: 6.2 g/dL — ABNORMAL LOW (ref 6.5–8.1)

## 2018-03-11 LAB — ECHOCARDIOGRAM COMPLETE
Height: 66.5 in
Weight: 2038.4 oz

## 2018-03-11 LAB — PROTIME-INR
INR: 0.98
Prothrombin Time: 12.9 seconds (ref 11.4–15.2)

## 2018-03-11 LAB — HEMOGLOBIN A1C
Hgb A1c MFr Bld: 7.1 % — ABNORMAL HIGH (ref 4.8–5.6)
Mean Plasma Glucose: 157.07 mg/dL

## 2018-03-11 LAB — ABO/RH: ABO/RH(D): O NEG

## 2018-03-11 LAB — TYPE AND SCREEN
ABO/RH(D): O NEG
Antibody Screen: NEGATIVE

## 2018-03-11 LAB — GLUCOSE, CAPILLARY
GLUCOSE-CAPILLARY: 117 mg/dL — AB (ref 65–99)
GLUCOSE-CAPILLARY: 142 mg/dL — AB (ref 65–99)

## 2018-03-11 MED ORDER — SODIUM CHLORIDE 0.9 % IV SOLN
750.0000 mg | INTRAVENOUS | Status: AC
  Administered 2018-03-12: 750 mg via INTRAVENOUS
  Filled 2018-03-11: qty 750

## 2018-03-11 MED ORDER — SODIUM CHLORIDE 0.9 % IV SOLN
30.0000 ug/min | INTRAVENOUS | Status: AC
  Administered 2018-03-12: 10 ug/min via INTRAVENOUS
  Filled 2018-03-11: qty 20

## 2018-03-11 MED ORDER — METOPROLOL TARTRATE 12.5 MG HALF TABLET
12.5000 mg | ORAL_TABLET | Freq: Once | ORAL | Status: AC
  Administered 2018-03-12: 12.5 mg via ORAL
  Filled 2018-03-11: qty 1

## 2018-03-11 MED ORDER — BISACODYL 5 MG PO TBEC: 5.0000 mg | DELAYED_RELEASE_TABLET | Freq: Once | ORAL | Status: DC

## 2018-03-11 MED ORDER — TRANEXAMIC ACID 1000 MG/10ML IV SOLN
1.5000 mg/kg/h | INTRAVENOUS | Status: DC
  Filled 2018-03-11: qty 25

## 2018-03-11 MED ORDER — POTASSIUM CHLORIDE 2 MEQ/ML IV SOLN
80.0000 meq | INTRAVENOUS | Status: DC
  Filled 2018-03-11: qty 40

## 2018-03-11 MED ORDER — MILRINONE LACTATE IN DEXTROSE 20-5 MG/100ML-% IV SOLN
0.3750 ug/kg/min | INTRAVENOUS | Status: DC
  Filled 2018-03-11: qty 100

## 2018-03-11 MED ORDER — SODIUM CHLORIDE 0.9 % IV SOLN
1250.0000 mg | INTRAVENOUS | Status: AC
  Administered 2018-03-12: 1250 mg via INTRAVENOUS
  Filled 2018-03-11: qty 1250

## 2018-03-11 MED ORDER — GLUCERNA SHAKE PO LIQD
237.0000 mL | ORAL | Status: DC
  Administered 2018-03-11: 237 mL via ORAL

## 2018-03-11 MED ORDER — EPINEPHRINE PF 1 MG/ML IJ SOLN
0.0000 ug/min | INTRAMUSCULAR | Status: DC
  Filled 2018-03-11: qty 4

## 2018-03-11 MED ORDER — SODIUM CHLORIDE 0.9 % IV SOLN
INTRAVENOUS | Status: DC
  Filled 2018-03-11: qty 30

## 2018-03-11 MED ORDER — SODIUM CHLORIDE 0.9 % IV SOLN
1.5000 g | INTRAVENOUS | Status: AC
  Administered 2018-03-12: 1.5 g via INTRAVENOUS
  Filled 2018-03-11: qty 1.5

## 2018-03-11 MED ORDER — CHLORHEXIDINE GLUCONATE CLOTH 2 % EX PADS
6.0000 | MEDICATED_PAD | Freq: Once | CUTANEOUS | Status: AC
  Administered 2018-03-11: 6 via TOPICAL

## 2018-03-11 MED ORDER — TRANEXAMIC ACID (OHS) PUMP PRIME SOLUTION
2.0000 mg/kg | INTRAVENOUS | Status: DC
  Filled 2018-03-11: qty 1.16

## 2018-03-11 MED ORDER — TEMAZEPAM 15 MG PO CAPS: 15.0000 mg | ORAL_CAPSULE | Freq: Once | ORAL | Status: DC | PRN

## 2018-03-11 MED ORDER — PLASMA-LYTE 148 IV SOLN
INTRAVENOUS | Status: AC
  Administered 2018-03-12: 500 mL
  Filled 2018-03-11: qty 2.5

## 2018-03-11 MED ORDER — CHLORHEXIDINE GLUCONATE 0.12 % MT SOLN
15.0000 mL | Freq: Once | OROMUCOSAL | Status: AC
  Administered 2018-03-12: 15 mL via OROMUCOSAL
  Filled 2018-03-11: qty 15

## 2018-03-11 MED ORDER — DEXMEDETOMIDINE HCL IN NACL 400 MCG/100ML IV SOLN
0.1000 ug/kg/h | INTRAVENOUS | Status: AC
  Administered 2018-03-12: .8 ug/kg/h via INTRAVENOUS
  Filled 2018-03-11: qty 100

## 2018-03-11 MED ORDER — SODIUM CHLORIDE 0.9 % IV SOLN
INTRAVENOUS | Status: AC
  Administered 2018-03-12: 1 [IU]/h via INTRAVENOUS
  Filled 2018-03-11: qty 1

## 2018-03-11 MED ORDER — NITROGLYCERIN IN D5W 200-5 MCG/ML-% IV SOLN
2.0000 ug/min | INTRAVENOUS | Status: AC
  Administered 2018-03-12: 5 ug/min via INTRAVENOUS
  Filled 2018-03-11: qty 250

## 2018-03-11 MED ORDER — MAGNESIUM SULFATE 50 % IJ SOLN
40.0000 meq | INTRAMUSCULAR | Status: DC
  Filled 2018-03-11: qty 9.85

## 2018-03-11 MED ORDER — CHLORHEXIDINE GLUCONATE CLOTH 2 % EX PADS
6.0000 | MEDICATED_PAD | Freq: Once | CUTANEOUS | Status: AC
  Administered 2018-03-12: 6 via TOPICAL

## 2018-03-11 MED ORDER — TRANEXAMIC ACID (OHS) BOLUS VIA INFUSION
15.0000 mg/kg | INTRAVENOUS | Status: AC
  Administered 2018-03-12: 867 mg via INTRAVENOUS
  Filled 2018-03-11: qty 867

## 2018-03-11 MED ORDER — DOPAMINE-DEXTROSE 3.2-5 MG/ML-% IV SOLN
0.0000 ug/kg/min | INTRAVENOUS | Status: AC
  Administered 2018-03-12: 3 ug/kg/min via INTRAVENOUS
  Filled 2018-03-11: qty 250

## 2018-03-11 MED FILL — Heparin Sod (Porcine)-NaCl IV Soln 1000 Unit/500ML-0.9%: INTRAVENOUS | Qty: 1000 | Status: AC

## 2018-03-11 NOTE — Progress Notes (Signed)
[address]       [place] [id number]             [phone]                 1 Day Post-Op Procedure(s) (LRB): LEFT HEART CATH AND CORONARY ANGIOGRAPHY (N/A)  LOS: 1 day   Subjective: Patient without recurrent chest pain today  Objective: Vital signs in last 24 hours: Patient Vitals for the past 24 hrs:  BP Temp Temp src Pulse Resp SpO2 Weight  03/11/18 1513 128/79 98 F (36.7 C) Oral 80 - 99 % -  03/11/18 0510 112/77 97.9 F (36.6 C) Oral 73 16 98 % 127 lb 6.4 oz (57.8 kg)  03/10/18 2053 119/86 98.9 F (37.2 C) Oral 84 17 98 % -    Filed Weights   03/10/18 0834 03/10/18 1514 03/11/18 0510  Weight: 133 lb (60.3 kg) 130 lb 8 oz (59.2 kg) 127 lb 6.4 oz (57.8 kg)    Hemodynamic parameters for last 24 hours:    Intake/Output from previous day: 05/22 0701 - 05/23 0700 In: 3205.3 [P.O.:720; I.V.:2485.3] Out: 1000 [Urine:1000] Intake/Output this shift: No intake/output data recorded.  Scheduled Meds: . aspirin  81 mg Oral Daily  . bisacodyl  5 mg Oral Once  . [START ON 03/12/2018] chlorhexidine  15 mL Mouth/Throat Once  . Chlorhexidine Gluconate Cloth  6 each Topical Once   And  . Chlorhexidine Gluconate Cloth  6 each Topical Once  . feeding supplement (GLUCERNA SHAKE)  237 mL Oral Q24H  . glipiZIDE  20 mg Oral Q breakfast  . [START ON 03/12/2018] heparin-papaverine-plasmalyte irrigation   Irrigation To OR  . linagliptin  5 mg Oral Daily  . [START ON 03/12/2018] magnesium sulfate  40 mEq Other To OR  . [START ON 03/12/2018] metoprolol tartrate  12.5 mg Oral Once  . pioglitazone  30 mg Oral Daily  . [START ON 03/12/2018] potassium chloride  80 mEq Other To OR  . sodium chloride flush  3 mL Intravenous Q12H  . [START ON 03/12/2018] tranexamic acid  15 mg/kg Intravenous To OR  . [START ON 03/12/2018] tranexamic acid  2 mg/kg Intracatheter To OR   Continuous Infusions: . sodium chloride    . [START ON 03/12/2018] cefUROXime (ZINACEF)  IV    . [START  ON 03/12/2018] cefUROXime (ZINACEF)  IV    . [START ON 03/12/2018] dexmedetomidine    . [START ON 03/12/2018] DOPamine    . [START ON 03/12/2018] epinephrine    . [START ON 03/12/2018] heparin 30,000 units/NS 1000 mL solution for CELLSAVER    . [START ON 03/12/2018] insulin (NOVOLIN-R) infusion    . [START ON 03/12/2018] milrinone    . [START ON 03/12/2018] nitroGLYCERIN    . [START ON 03/12/2018] phenylephrine /239mL NS (0.08mg /ml) infusion    . [START ON 03/12/2018] tranexamic acid (CYKLOKAPRON) infusion ([facility])    . [START ON 03/12/2018] vancomycin     PRN Meds:.sodium chloride, acetaminophen, nitroGLYCERIN, ondansetron (ZOFRAN) IV, sodium chloride flush, temazepam  General appearance: alert, cooperative and no distress Neurologic: intact Heart: regular rate and rhythm, S1, S2 normal, no murmur, click, rub or gallop Lungs: clear to auscultation bilaterally Abdomen: soft, non-tender; bowel sounds normal; no masses,  no organomegaly Extremities: extremities normal, atraumatic, no cyanosis or edema Wound: Right radial cath site intact  Lab Results: CBC: Recent Labs    03/09/18 1000 03/11/18 0330  WBC 5.5 6.1  HGB 12.9* 12.8*  HCT 38.5 39.1  PLT 165 149*   BMET:  Recent Labs    03/09/18 1000 03/11/18 0330  NA 137 140  K 5.3* 4.9  CL 104 109  CO2 21 23  GLUCOSE 167* 83  BUN 25* 18  CREATININE 1.56* 1.54*  CALCIUM 9.2 9.0    PT/INR:  Recent Labs    03/11/18 0330  LABPROT 12.9  INR 0.98     Radiology Dg Chest 2 View  Result Date: 03/11/2018 CLINICAL DATA:  Preoperative evaluation for upcoming coronary bypass grafting EXAM: CHEST - 2 VIEW COMPARISON:  None. FINDINGS: The heart size and mediastinal contours are within normal limits. Both lungs are clear. The visualized skeletal structures are unremarkable. Bilateral nipple shadows are noted. IMPRESSION: No active cardiopulmonary disease. Electronically Signed   By: Alcide Clever M.D.   On: 03/11/2018 09:32      Assessment/Plan: S/P Procedure(s) (LRB): LEFT HEART CATH AND CORONARY ANGIOGRAPHY (N/A)  Carotid Dopplers and echocardiogram done, no significant valve disease noted on echocardiogram.  I discussed with the patient proceeding with coronary artery bypass grafting as outlined yesterday. The goals risks and alternatives of the planned surgical procedure coronary artery bypass grafting have been discussed with the patient in detail. The risks of the procedure including death, infection, stroke, myocardial infarction, bleeding, blood transfusion have all been discussed specifically.  I have quoted Daniel Malone a 2 % of perioperative mortality and a complication rate as high as 40 %. The patient's questions have been answered.Daniel Malone is willing  to proceed with the planned procedure.  Patient does have stage III chronic renal insufficiency, his creatinine is stable this morning   Pharmacy has researched drug and anesthetic side effects if he does in fact have MELARS in the left noted on chart. Patient does note significant myalgias when he had been on statins previously, I stopped the 80 mg of Lipitor a day this morning  Daniel Ovens MD 03/11/2018 7:12 PM     Patient ID: Daniel Malone, male   DOB: 09-Dec-1960, 57 y.o.   MRN: [id number]

## 2018-03-11 NOTE — Progress Notes (Signed)
Consent for CABG and Blood obtained and placed on chart.

## 2018-03-11 NOTE — Progress Notes (Signed)
Initial Nutrition Assessment  DOCUMENTATION CODES:   Not applicable  INTERVENTION:    Glucerna Shake po once daily, each supplement provides 220 kcal and 10 grams of protein  Carnation Breakfast Essentials BID with meals, each supplement provides 225 kcal and 13 gm protein.  NUTRITION DIAGNOSIS:   Increased nutrient needs related to chronic illness as evidenced by estimated needs.  GOAL:   Patient will meet greater than or equal to 90% of their needs  MONITOR:   PO intake, Supplement acceptance, Skin, Labs  REASON FOR ASSESSMENT:   Malnutrition Screening Tool    ASSESSMENT:   57 yo male with PMH of HTN, MELAS syndrome, PVD, DM-2, hearing loss, and CKD-stage 3 who was admitted on 5/22 with chest pain. Cardiac cath showed severe disease in the proximal LAD, ramus, and circumflex.  Plans for CABG surgery tomorrow. Patient reports progressive weight loss over the past year despite good appetite and good intake; down from 148 lbs one year ago to 127 lbs this morning. 14% weight loss within one year is not significant for the time frame.   Suspect increased nutrient needs related to chronic illness (CKD).  He consumed 100% of lunch today.  He agreed to try Glucerna Shake and [employer] to maximize oral intake.  Labs and medications reviewed. CBG's: 117-142  NUTRITION - FOCUSED PHYSICAL EXAM:    Most Recent Value  Orbital Region  No depletion  Upper Arm Region  No depletion  Thoracic and Lumbar Region  No depletion  Buccal Region  No depletion  Temple Region  No depletion  Clavicle Bone Region  Mild depletion  Clavicle and Acromion Bone Region  Mild depletion  Scapular Bone Region  No depletion  Dorsal Hand  No depletion  Patellar Region  Unable to assess  Anterior Thigh Region  Unable to assess  Posterior Calf Region  Unable to assess  Edema (RD Assessment)  Unable to assess  Hair  Reviewed  Eyes  Reviewed  Mouth  Reviewed  Skin  Reviewed   Nails  Reviewed       Diet Order:   Diet Order           Diet NPO time specified  Diet effective midnight        Diet Carb Modified Fluid consistency: Thin; Room service appropriate? Yes  Diet effective now          EDUCATION NEEDS:   Education needs have been addressed  Skin:  Skin Assessment: Reviewed RN Assessment  Last BM:  5/21  Height:   Ht Readings from Last 1 Encounters:  03/10/18 5' 6.5" (1.689 m)    Weight:   Wt Readings from Last 1 Encounters:  03/11/18 127 lb 6.4 oz (57.8 kg)    Ideal Body Weight:  65.9 kg  BMI:  Body mass index is 20.25 kg/m.  Estimated Nutritional Needs:   Kcal:  1800-2000  Protein:  80-90 gm  Fluid:  1.8 L    Joaquin Courts, RD, LDN, CNSC Pager [phone] After Hours Pager [phone]

## 2018-03-11 NOTE — Care Management Note (Signed)
Case Management Note  Patient Details  Name: Daniel Malone MRN: [id number] Date of Birth: 08-16-1961  Subjective/Objective: Pt presented for Chest Pain- s/p cath 03-10-18 that revealed Severe disease in the proximal LAD, ramus and circumflex. Plan for CABG 03-11-17. PTA from home with the support of wife and daughter. 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 Pt has PCP: Nonnie Done., MD in [facility]. Pt uses [facility] for medications and he gets his Januvia via an Water engineer following the patient.                Action/Plan: CM will continue to monitor for additional home needs post surgery.   Expected Discharge Date:                  Expected Discharge Plan:  Home/Self Care  In-House Referral:  Financial Counselor  Discharge planning Services  CM Consult  Post Acute Care Choice:    Choice offered to:     DME Arranged:    DME Agency:     HH Arranged:    HH Agency:     Status of Service:  In process, will continue to follow  If discussed at Long Length of Stay Meetings, dates discussed:    Additional Comments:  Gala Lewandowsky, RN 03/11/2018, 11:03 AM

## 2018-03-11 NOTE — Progress Notes (Signed)
  Echocardiogram 2D Echocardiogram has been performed.  Celene Skeen 03/11/2018, 10:41 AM

## 2018-03-11 NOTE — Progress Notes (Signed)
CARDIAC REHAB PHASE I   PRE:  Rate/Rhythm: 89 SR  BP:  Supine:   Sitting: 132/84  Standing:    SaO2: 99%RA  MODE:  Ambulation: 375 ft   POST:  Rate/Rhythm: 107 ST  BP:  Supine:   Sitting: 139/83  Standing:    SaO2: 98%RA 0930-1002 Pt walked 375 ft on RA with steady gait. Tolerated well. Pt had [facility] booklet and it was written down how to view pre op video. Gave pt care guide and in the tube handout. Discussed importance of IS and mobility after walk. Gave IS and pt demonstrated 2000 ml correctly. Discussed sternal precautions. Pt stated wife and family members will be available 24/7 first week home.  Will follow up after surgery.   Luetta Nutting, RN BSN  03/11/2018 10:00 AM

## 2018-03-11 NOTE — Progress Notes (Signed)
Pre-CABG testing has been completed. 1-39% ICA stenosis bilaterally.  ABI's Right 1.08 Left 1.14  03/11/18 12:44 PM Olen Cordial RVT

## 2018-03-11 NOTE — Progress Notes (Signed)
Progress Note  Patient Name: Daniel Malone Date of Encounter: 03/11/2018  Primary Cardiologist:   Garwin Brothers, MD   Subjective   No chest pain.  No SOB.   Inpatient Medications    Scheduled Meds: . aspirin  81 mg Oral Daily  . glipiZIDE  20 mg Oral Q breakfast  . linagliptin  5 mg Oral Daily  . pioglitazone  30 mg Oral Daily  . sodium chloride flush  3 mL Intravenous Q12H   Continuous Infusions: . sodium chloride     PRN Meds: sodium chloride, acetaminophen, nitroGLYCERIN, ondansetron (ZOFRAN) IV, sodium chloride flush   Vital Signs    Vitals:   03/10/18 1410 03/10/18 1514 03/10/18 2053 03/11/18 0510  BP: 120/75 118/77 119/86 112/77  Pulse: 79 70 84 73  Resp: Temp:  97.9 F (36.6 C) 98.9 F (37.2 C) 97.9 F (36.6 C)  TempSrc:  Axillary Oral Oral  SpO2: 100% 100% 98% 98%  Weight:  130 lb 8 oz (59.2 kg)  127 lb 6.4 oz (57.8 kg)  Height:  5' 6.5" (1.689 m)      Intake/Output Summary (Last 24 hours) at 03/11/2018 0851 Last data filed at 03/11/2018 0753 Gross per 24 hour  Intake 3325.28 ml  Output 1000 ml  Net 2325.28 ml   Filed Weights   03/10/18 0834 03/10/18 1514 03/11/18 0510  Weight: 133 lb (60.3 kg) 130 lb 8 oz (59.2 kg) 127 lb 6.4 oz (57.8 kg)    Telemetry    NSR - Personally Reviewed  ECG    NA - Personally Reviewed  Physical Exam   GEN: No acute distress.   Neck: No  JVD Cardiac: RRR, no murmurs, rubs, or gallops.  Respiratory: Clear  to auscultation bilaterally. GI: Soft, nontender, non-distended  MS: No edema; No deformity.  Right wrist without bruising or bleeding.  Neuro:  Nonfocal  Psych: Normal affect   Labs    Chemistry Recent Labs  Lab 03/09/18 1000 03/11/18 0330  NA 137 140  K 5.3* 4.9  CL 104 109  CO2 21 23  GLUCOSE 167* 83  BUN 25* 18  CREATININE 1.56* 1.54*  CALCIUM 9.2 9.0  PROT  --  6.2*  ALBUMIN  --  3.5  AST  --  14*  ALT  --  16*  ALKPHOS  --  52  BILITOT  --  0.8  GFRNONAA 49* 48*    GFRAA 56* 56*  ANIONGAP  --  8     Hematology Recent Labs  Lab 03/09/18 1000 03/11/18 0330  WBC 5.5 6.1  RBC 4.14 4.17*  HGB 12.9* 12.8*  HCT 38.5 39.1  MCV 93 93.8  MCH 31.2 30.7  MCHC 33.5 32.7  RDW 14.4 13.0  PLT 165 149*    Cardiac EnzymesNo results for input(s): TROPONINI in the last 168 hours. No results for input(s): TROPIPOC in the last 168 hours.   BNPNo results for input(s): BNP, PROBNP in the last 168 hours.   DDimer No results for input(s): DDIMER in the last 168 hours.   Radiology    No results found.  Cardiac Studies   CATH:  Conclusion     Ost Ramus to Ramus lesion is 80% stenosed.  Ost Cx to Prox Cx lesion is 80% stenosed.  Mid Cx lesion is 50% stenosed.  Prox RCA lesion is 25% stenosed.  Ost LAD to Prox LAD lesion is 75% stenosed.  Ost 1st Diag lesion is 80% stenosed.  Mid LAD lesion is 25% stenosed.  The left ventricular systolic function is normal.  LV end diastolic pressure is normal.  The left ventricular ejection fraction is 55-65% by visual estimate.  There is no aortic valve stenosis.   Severe disease in the proximal LAD, ramus and circumflex.  Long, diffuse lesions which did not respond to IC NTG.     Patient Profile     57 y.o. male with DM, HTN, who presented with chest pain.    Assessment & Plan    CAD:   CABG Friday.  Preop op studies pending.   HTN:  BP controlled on the meds as listed.   TOBACCO ABUSE:  Educated (uses smokeless) and we will continue to educate.    DYSLIPIDEMIA:    I don't see a lipid profile in the system.  He cannot take a statin.  We will can get a lipid profile before discharge and consider other therapy making sure that it is OK with his history of MELAS  DM:    A1c 7.1.  Continue current therapy.  Consider Jardiance at discharge.   CKD III:     Creat stable.    For questions or updates, please contact [facility] Please consult [email or web address] for contact info under  Cardiology/STEMI.   Signed, Rollene Rotunda, MD  03/11/2018, 8:51 AM

## 2018-03-12 ENCOUNTER — Encounter (HOSPITAL_COMMUNITY): Payer: Self-pay | Admitting: Anesthesiology

## 2018-03-12 ENCOUNTER — Inpatient Hospital Stay (HOSPITAL_COMMUNITY): Payer: Self-pay

## 2018-03-12 ENCOUNTER — Inpatient Hospital Stay (HOSPITAL_COMMUNITY): Payer: Self-pay | Admitting: Certified Registered"

## 2018-03-12 ENCOUNTER — Encounter (HOSPITAL_COMMUNITY)
Admission: RE | Disposition: A | Discharge status: Home / Self Care | Payer: Self-pay | Source: Ambulatory Visit | Attending: Cardiothoracic Surgery

## 2018-03-12 DIAGNOSIS — Z951 Presence of aortocoronary bypass graft: Secondary | ICD-10-CM

## 2018-03-12 HISTORY — PX: CORONARY ARTERY BYPASS GRAFT: SHX141

## 2018-03-12 HISTORY — PX: TEE WITHOUT CARDIOVERSION: SHX5443

## 2018-03-12 LAB — HEMOGLOBIN AND HEMATOCRIT, BLOOD
HCT: 26.3 % — ABNORMAL LOW (ref 39.0–52.0)
Hemoglobin: 8.9 g/dL — ABNORMAL LOW (ref 13.0–17.0)

## 2018-03-12 LAB — POCT I-STAT 3, ART BLOOD GAS (G3+)
ACID-BASE DEFICIT: 3 mmol/L — AB (ref 0.0–2.0)
ACID-BASE DEFICIT: 4 mmol/L — AB (ref 0.0–2.0)
ACID-BASE DEFICIT: 5 mmol/L — AB (ref 0.0–2.0)
Acid-base deficit: 7 mmol/L — ABNORMAL HIGH (ref 0.0–2.0)
BICARBONATE: 18.9 mmol/L — AB (ref 20.0–28.0)
BICARBONATE: 23 mmol/L (ref 20.0–28.0)
BICARBONATE: 21 mmol/L (ref 20.0–28.0)
Bicarbonate: 21.8 mmol/L (ref 20.0–28.0)
O2 SAT: 100 %
O2 SAT: 99 %
O2 SAT: 99 %
O2 Saturation: 99 %
PCO2 ART: 33 mmHg (ref 32.0–48.0)
PCO2 ART: 44.3 mmHg (ref 32.0–48.0)
PH ART: 7.355 (ref 7.350–7.450)
PO2 ART: 177 mmHg — AB (ref 83.0–108.0)
PO2 ART: 183 mmHg — AB (ref 83.0–108.0)
PO2 ART: 158 mmHg — AB (ref 83.0–108.0)
Patient temperature: 34.8
Patient temperature: 35.4
Patient temperature: 37
Patient temperature: 37
TCO2: 20 mmol/L — AB (ref 22–32)
TCO2: 24 mmol/L (ref 22–32)
TCO2: 23 mmol/L (ref 22–32)
TCO2: 22 mmol/L (ref 22–32)
pCO2 arterial: 40.4 mmHg (ref 32.0–48.0)
pCO2 arterial: 42.2 mmHg (ref 32.0–48.0)
pH, Arterial: 7.355 (ref 7.350–7.450)
pH, Arterial: 7.3 — ABNORMAL LOW (ref 7.350–7.450)
pH, Arterial: 7.304 — ABNORMAL LOW (ref 7.350–7.450)
pO2, Arterial: 126 mmHg — ABNORMAL HIGH (ref 83.0–108.0)

## 2018-03-12 LAB — BLOOD GAS, ARTERIAL
Acid-base deficit: 1.7 mmol/L (ref 0.0–2.0)
Bicarbonate: 22.5 mmol/L (ref 20.0–28.0)
Drawn by: 270271
O2 Content: 21 L/min
O2 Saturation: 98.3 %
Patient temperature: 98.6
pCO2 arterial: 37.9 mmHg (ref 32.0–48.0)
pH, Arterial: 7.392 (ref 7.350–7.450)
pO2, Arterial: 120 mmHg — ABNORMAL HIGH (ref 83.0–108.0)

## 2018-03-12 LAB — CBC
HCT: 41.9 % (ref 39.0–52.0)
HCT: 28.8 % — ABNORMAL LOW (ref 39.0–52.0)
HEMATOCRIT: 28.1 % — AB (ref 39.0–52.0)
Hemoglobin: 13.8 g/dL (ref 13.0–17.0)
Hemoglobin: 9.5 g/dL — ABNORMAL LOW (ref 13.0–17.0)
Hemoglobin: 9.7 g/dL — ABNORMAL LOW (ref 13.0–17.0)
MCH: 30.7 pg (ref 26.0–34.0)
MCH: 31.4 pg (ref 26.0–34.0)
MCH: 31.1 pg (ref 26.0–34.0)
MCHC: 32.9 g/dL (ref 30.0–36.0)
MCHC: 33.8 g/dL (ref 30.0–36.0)
MCHC: 33.7 g/dL (ref 30.0–36.0)
MCV: 93.1 fL (ref 78.0–100.0)
MCV: 92.7 fL (ref 78.0–100.0)
MCV: 92.3 fL (ref 78.0–100.0)
PLATELETS: 79 10*3/uL — AB (ref 150–400)
Platelets: 168 10*3/uL (ref 150–400)
Platelets: 87 10*3/uL — ABNORMAL LOW (ref 150–400)
RBC: 4.5 MIL/uL (ref 4.22–5.81)
RBC: 3.03 MIL/uL — ABNORMAL LOW (ref 4.22–5.81)
RBC: 3.12 MIL/uL — ABNORMAL LOW (ref 4.22–5.81)
RDW: 12.6 % (ref 11.5–15.5)
RDW: 12.5 % (ref 11.5–15.5)
RDW: 12.9 % (ref 11.5–15.5)
WBC: 6.8 10*3/uL (ref 4.0–10.5)
WBC: 7 10*3/uL (ref 4.0–10.5)
WBC: 8.7 10*3/uL (ref 4.0–10.5)

## 2018-03-12 LAB — GLUCOSE, CAPILLARY
GLUCOSE-CAPILLARY: 124 mg/dL — AB (ref 65–99)
GLUCOSE-CAPILLARY: 105 mg/dL — AB (ref 65–99)
GLUCOSE-CAPILLARY: 101 mg/dL — AB (ref 65–99)
Glucose-Capillary: 130 mg/dL — ABNORMAL HIGH (ref 65–99)
Glucose-Capillary: 44 mg/dL — CL (ref 65–99)
Glucose-Capillary: 117 mg/dL — ABNORMAL HIGH (ref 65–99)
Glucose-Capillary: 96 mg/dL (ref 65–99)
Glucose-Capillary: 128 mg/dL — ABNORMAL HIGH (ref 65–99)
Glucose-Capillary: 125 mg/dL — ABNORMAL HIGH (ref 65–99)

## 2018-03-12 LAB — ECHO TEE
Annulus: 2.4 cm
Ao-asc: 2.9 cm
Ao-desc: 2.3 cm
STJ: 2.8 cm
Sinus: 3.3 cm

## 2018-03-12 LAB — POCT I-STAT 4, (NA,K, GLUC, HGB,HCT)
Glucose, Bld: 78 mg/dL (ref 65–99)
HCT: 24 % — ABNORMAL LOW (ref 39.0–52.0)
Hemoglobin: 8.2 g/dL — ABNORMAL LOW (ref 13.0–17.0)
Potassium: 4.2 mmol/L (ref 3.5–5.1)
Sodium: 141 mmol/L (ref 135–145)

## 2018-03-12 LAB — BASIC METABOLIC PANEL
Anion gap: 10 (ref 5–15)
BUN: 24 mg/dL — ABNORMAL HIGH (ref 6–20)
CO2: 23 mmol/L (ref 22–32)
Calcium: 9.6 mg/dL (ref 8.9–10.3)
Chloride: 104 mmol/L (ref 101–111)
Creatinine, Ser: 1.63 mg/dL — ABNORMAL HIGH (ref 0.61–1.24)
GFR calc Af Amer: 52 mL/min — ABNORMAL LOW (ref >60)
GFR calc non Af Amer: 45 mL/min — ABNORMAL LOW (ref >60)
Glucose, Bld: 103 mg/dL — ABNORMAL HIGH (ref 65–99)
Potassium: 5.4 mmol/L — ABNORMAL HIGH (ref 3.5–5.1)
Sodium: 137 mmol/L (ref 135–145)

## 2018-03-12 LAB — POCT I-STAT, CHEM 8
BUN: 18 mg/dL (ref 6–20)
CHLORIDE: 107 mmol/L (ref 101–111)
CREATININE: 1.2 mg/dL (ref 0.61–1.24)
Calcium, Ion: 1.19 mmol/L (ref 1.15–1.40)
Glucose, Bld: 104 mg/dL — ABNORMAL HIGH (ref 65–99)
HEMATOCRIT: 25 % — AB (ref 39.0–52.0)
HEMOGLOBIN: 8.5 g/dL — AB (ref 13.0–17.0)
POTASSIUM: 4.6 mmol/L (ref 3.5–5.1)
Sodium: 139 mmol/L (ref 135–145)
TCO2: 22 mmol/L (ref 22–32)

## 2018-03-12 LAB — MAGNESIUM: Magnesium: 2.5 mg/dL — ABNORMAL HIGH (ref 1.7–2.4)

## 2018-03-12 LAB — PROTIME-INR
INR: 1.48
Prothrombin Time: 17.8 seconds — ABNORMAL HIGH (ref 11.4–15.2)

## 2018-03-12 LAB — APTT: aPTT: 32 seconds (ref 24–36)

## 2018-03-12 LAB — CREATININE, SERUM
Creatinine, Ser: 1.37 mg/dL — ABNORMAL HIGH (ref 0.61–1.24)
GFR calc Af Amer: >60 mL/min (ref >60)
GFR calc non Af Amer: 56 mL/min — ABNORMAL LOW (ref >60)

## 2018-03-12 LAB — PLATELET COUNT: Platelets: 106 10*3/uL — ABNORMAL LOW (ref 150–400)

## 2018-03-12 SURGERY — CORONARY ARTERY BYPASS GRAFTING (CABG)
Anesthesia: General | Site: Chest

## 2018-03-12 MED ORDER — SODIUM CHLORIDE 0.45 % IV SOLN
INTRAVENOUS | Status: DC | PRN
  Administered 2018-03-12: 10 mL/h via INTRAVENOUS

## 2018-03-12 MED ORDER — METOPROLOL TARTRATE 12.5 MG HALF TABLET
12.5000 mg | ORAL_TABLET | Freq: Two times a day (BID) | ORAL | Status: DC
  Administered 2018-03-13 – 2018-03-14 (×3): 12.5 mg via ORAL
  Filled 2018-03-12 (×3): qty 1

## 2018-03-12 MED ORDER — CEFUROXIME SODIUM 1.5 G IV SOLR
1.5000 g | Freq: Two times a day (BID) | INTRAVENOUS | Status: AC
  Administered 2018-03-12 – 2018-03-14 (×4): 1.5 g via INTRAVENOUS
  Filled 2018-03-12 (×4): qty 1.5

## 2018-03-12 MED ORDER — LIDOCAINE 2% (20 MG/ML) 5 ML SYRINGE
INTRAMUSCULAR | Status: AC
  Filled 2018-03-12: qty 5

## 2018-03-12 MED ORDER — MAGNESIUM SULFATE 4 GM/100ML IV SOLN
INTRAVENOUS | Status: AC
  Administered 2018-03-12: 2 g via INTRAVENOUS
  Filled 2018-03-12: qty 100

## 2018-03-12 MED ORDER — SODIUM BICARBONATE 8.4 % IV SOLN
50.0000 meq | Freq: Once | INTRAVENOUS | Status: AC
  Administered 2018-03-12: 50 meq via INTRAVENOUS

## 2018-03-12 MED ORDER — MIDAZOLAM HCL 10 MG/2ML IJ SOLN
INTRAMUSCULAR | Status: AC
  Filled 2018-03-12: qty 2

## 2018-03-12 MED ORDER — LACTATED RINGERS IV SOLN: INTRAVENOUS | Status: DC

## 2018-03-12 MED ORDER — ARTIFICIAL TEARS OPHTHALMIC OINT
TOPICAL_OINTMENT | OPHTHALMIC | Status: DC | PRN
  Administered 2018-03-12: 1 via OPHTHALMIC

## 2018-03-12 MED ORDER — MIDAZOLAM HCL 5 MG/5ML IJ SOLN
INTRAMUSCULAR | Status: DC | PRN
  Administered 2018-03-12: 2 mg via INTRAVENOUS
  Administered 2018-03-12: 7 mg via INTRAVENOUS
  Administered 2018-03-12: 1 mg via INTRAVENOUS
  Administered 2018-03-12 (×5): 2 mg via INTRAVENOUS

## 2018-03-12 MED ORDER — SODIUM CHLORIDE 0.9 % IV SOLN
INTRAVENOUS | Status: DC | PRN
  Administered 2018-03-12: 08:00:00 via INTRAVENOUS

## 2018-03-12 MED ORDER — FENTANYL CITRATE (PF) 100 MCG/2ML IJ SOLN
25.0000 ug | INTRAMUSCULAR | Status: DC | PRN
  Administered 2018-03-12 – 2018-03-14 (×2): 25 ug via INTRAVENOUS
  Filled 2018-03-12 (×2): qty 2

## 2018-03-12 MED ORDER — HEPARIN SODIUM (PORCINE) 1000 UNIT/ML IJ SOLN
INTRAMUSCULAR | Status: DC | PRN
  Administered 2018-03-12: 21000 [IU] via INTRAVENOUS

## 2018-03-12 MED ORDER — ACETAMINOPHEN 160 MG/5ML PO SOLN: 650.0000 mg | Freq: Once | ORAL | Status: AC

## 2018-03-12 MED ORDER — DEXTROSE 50 % IV SOLN
INTRAVENOUS | Status: AC
  Administered 2018-03-12: 25 mL
  Filled 2018-03-12: qty 50

## 2018-03-12 MED ORDER — NITROGLYCERIN IN D5W 200-5 MCG/ML-% IV SOLN: 0.0000 ug/min | INTRAVENOUS | Status: DC

## 2018-03-12 MED ORDER — FENTANYL CITRATE (PF) 250 MCG/5ML IJ SOLN
INTRAMUSCULAR | Status: AC
  Filled 2018-03-12: qty 5

## 2018-03-12 MED ORDER — ASPIRIN EC 325 MG PO TBEC
325.0000 mg | DELAYED_RELEASE_TABLET | Freq: Every day | ORAL | Status: DC
  Administered 2018-03-13 – 2018-03-14 (×2): 325 mg via ORAL
  Filled 2018-03-12 (×2): qty 1

## 2018-03-12 MED ORDER — METOPROLOL TARTRATE 5 MG/5ML IV SOLN: 2.5000 mg | INTRAVENOUS | Status: DC | PRN

## 2018-03-12 MED ORDER — PROPOFOL 10 MG/ML IV BOLUS
INTRAVENOUS | Status: DC | PRN
  Administered 2018-03-12: 50 mg via INTRAVENOUS
  Administered 2018-03-12: 30 mg via INTRAVENOUS

## 2018-03-12 MED ORDER — CHLORHEXIDINE GLUCONATE 0.12 % MT SOLN
15.0000 mL | OROMUCOSAL | Status: AC
  Administered 2018-03-12: 15 mL via OROMUCOSAL

## 2018-03-12 MED ORDER — BISACODYL 5 MG PO TBEC
10.0000 mg | DELAYED_RELEASE_TABLET | Freq: Every day | ORAL | Status: DC
  Administered 2018-03-13 – 2018-03-14 (×2): 10 mg via ORAL
  Filled 2018-03-12 (×2): qty 2

## 2018-03-12 MED ORDER — HEMOSTATIC AGENTS (NO CHARGE) OPTIME
TOPICAL | Status: DC | PRN
  Administered 2018-03-12: 1 via TOPICAL

## 2018-03-12 MED ORDER — INSULIN REGULAR HUMAN 100 UNIT/ML IJ SOLN
INTRAMUSCULAR | Status: DC
  Filled 2018-03-12: qty 1

## 2018-03-12 MED ORDER — 0.9 % SODIUM CHLORIDE (POUR BTL) OPTIME
TOPICAL | Status: DC | PRN
  Administered 2018-03-12: 5000 mL

## 2018-03-12 MED ORDER — PROPOFOL 10 MG/ML IV BOLUS
INTRAVENOUS | Status: AC
  Filled 2018-03-12: qty 20

## 2018-03-12 MED ORDER — DOPAMINE-DEXTROSE 3.2-5 MG/ML-% IV SOLN: 3.0000 ug/kg/min | INTRAVENOUS | Status: DC

## 2018-03-12 MED ORDER — BISACODYL 10 MG RE SUPP: 10.0000 mg | Freq: Every day | RECTAL | Status: DC

## 2018-03-12 MED ORDER — ASPIRIN 81 MG PO CHEW: 324.0000 mg | CHEWABLE_TABLET | Freq: Every day | ORAL | Status: DC

## 2018-03-12 MED ORDER — TRAMADOL HCL 50 MG PO TABS
50.0000 mg | ORAL_TABLET | ORAL | Status: DC | PRN
  Administered 2018-03-13: 100 mg via ORAL
  Filled 2018-03-12: qty 2

## 2018-03-12 MED ORDER — LACTATED RINGERS IV SOLN: 500.0000 mL | Freq: Once | INTRAVENOUS | Status: DC | PRN

## 2018-03-12 MED ORDER — PANTOPRAZOLE SODIUM 40 MG PO TBEC
40.0000 mg | DELAYED_RELEASE_TABLET | Freq: Every day | ORAL | Status: DC
  Administered 2018-03-14: 40 mg via ORAL
  Filled 2018-03-12: qty 1

## 2018-03-12 MED ORDER — DOCUSATE SODIUM 100 MG PO CAPS
200.0000 mg | ORAL_CAPSULE | Freq: Every day | ORAL | Status: DC
  Administered 2018-03-13 – 2018-03-14 (×2): 200 mg via ORAL
  Filled 2018-03-12 (×2): qty 2

## 2018-03-12 MED ORDER — FENTANYL CITRATE (PF) 250 MCG/5ML IJ SOLN
INTRAMUSCULAR | Status: DC | PRN
  Administered 2018-03-12 (×3): 250 ug via INTRAVENOUS
  Administered 2018-03-12: 50 ug via INTRAVENOUS
  Administered 2018-03-12: 250 ug via INTRAVENOUS
  Administered 2018-03-12: 100 ug via INTRAVENOUS
  Administered 2018-03-12: 150 ug via INTRAVENOUS
  Administered 2018-03-12: 450 ug via INTRAVENOUS
  Administered 2018-03-12: 250 ug via INTRAVENOUS

## 2018-03-12 MED ORDER — SODIUM CHLORIDE 0.9% FLUSH: 3.0000 mL | INTRAVENOUS | Status: DC | PRN

## 2018-03-12 MED ORDER — FENTANYL CITRATE (PF) 250 MCG/5ML IJ SOLN
INTRAMUSCULAR | Status: AC
  Filled 2018-03-12: qty 10

## 2018-03-12 MED ORDER — OXYCODONE HCL 5 MG PO TABS
5.0000 mg | ORAL_TABLET | ORAL | Status: DC | PRN
  Administered 2018-03-14 (×3): 5 mg via ORAL
  Filled 2018-03-12 (×3): qty 1

## 2018-03-12 MED ORDER — METOPROLOL TARTRATE 25 MG/10 ML ORAL SUSPENSION
12.5000 mg | Freq: Two times a day (BID) | ORAL | Status: DC
  Filled 2018-03-12: qty 5

## 2018-03-12 MED ORDER — INSULIN REGULAR BOLUS VIA INFUSION
0.0000 [IU] | Freq: Three times a day (TID) | INTRAVENOUS | Status: DC
  Filled 2018-03-12: qty 10

## 2018-03-12 MED ORDER — ACETAMINOPHEN 500 MG PO TABS
1000.0000 mg | ORAL_TABLET | Freq: Four times a day (QID) | ORAL | Status: DC
  Administered 2018-03-13 – 2018-03-14 (×4): 1000 mg via ORAL
  Filled 2018-03-12 (×6): qty 2

## 2018-03-12 MED ORDER — FUROSEMIDE 10 MG/ML IJ SOLN
INTRAMUSCULAR | Status: DC | PRN
  Administered 2018-03-12: 20 mg via INTRAMUSCULAR

## 2018-03-12 MED ORDER — ACETAMINOPHEN 160 MG/5ML PO SOLN: 1000.0000 mg | Freq: Four times a day (QID) | ORAL | Status: DC

## 2018-03-12 MED ORDER — FAMOTIDINE IN NACL 20-0.9 MG/50ML-% IV SOLN
20.0000 mg | Freq: Two times a day (BID) | INTRAVENOUS | Status: AC
  Administered 2018-03-12: 20 mg via INTRAVENOUS

## 2018-03-12 MED ORDER — ALBUMIN HUMAN 5 % IV SOLN
250.0000 mL | INTRAVENOUS | Status: AC | PRN
  Administered 2018-03-12 (×4): 250 mL via INTRAVENOUS
  Filled 2018-03-12 (×2): qty 250

## 2018-03-12 MED ORDER — ROCURONIUM BROMIDE 10 MG/ML (PF) SYRINGE
PREFILLED_SYRINGE | INTRAVENOUS | Status: DC | PRN
  Administered 2018-03-12 (×4): 50 mg via INTRAVENOUS

## 2018-03-12 MED ORDER — ARTIFICIAL TEARS OPHTHALMIC OINT
TOPICAL_OINTMENT | OPHTHALMIC | Status: AC
  Filled 2018-03-12: qty 3.5

## 2018-03-12 MED ORDER — ACETAMINOPHEN 650 MG RE SUPP
650.0000 mg | Freq: Once | RECTAL | Status: AC
  Administered 2018-03-12: 650 mg via RECTAL

## 2018-03-12 MED ORDER — ONDANSETRON HCL 4 MG/2ML IJ SOLN
4.0000 mg | Freq: Four times a day (QID) | INTRAMUSCULAR | Status: DC | PRN
  Administered 2018-03-12 – 2018-03-14 (×3): 4 mg via INTRAVENOUS
  Filled 2018-03-12 (×3): qty 2

## 2018-03-12 MED ORDER — LIDOCAINE HCL (CARDIAC) PF 100 MG/5ML IV SOSY
PREFILLED_SYRINGE | INTRAVENOUS | Status: DC | PRN
  Administered 2018-03-12: 40 mg via INTRAVENOUS

## 2018-03-12 MED ORDER — SODIUM CHLORIDE 0.9 % IV SOLN
0.0000 ug/min | INTRAVENOUS | Status: DC
  Filled 2018-03-12 (×2): qty 2

## 2018-03-12 MED ORDER — SODIUM CHLORIDE 0.9 % IV SOLN
250.0000 mL | INTRAVENOUS | Status: DC
  Administered 2018-03-13: 250 mL via INTRAVENOUS

## 2018-03-12 MED ORDER — ALBUMIN HUMAN 5 % IV SOLN
INTRAVENOUS | Status: DC | PRN
  Administered 2018-03-12 (×2): via INTRAVENOUS

## 2018-03-12 MED ORDER — DEXTROSE 10 % IV SOLN
INTRAVENOUS | Status: DC | PRN
  Administered 2018-03-12: 08:00:00 via INTRAVENOUS

## 2018-03-12 MED ORDER — MIDAZOLAM HCL 2 MG/2ML IJ SOLN: 2.0000 mg | INTRAMUSCULAR | Status: DC | PRN

## 2018-03-12 MED ORDER — MAGNESIUM SULFATE 4 GM/100ML IV SOLN
4.0000 g | Freq: Once | INTRAVENOUS | Status: AC
  Administered 2018-03-12: 2 g via INTRAVENOUS

## 2018-03-12 MED ORDER — ROCURONIUM BROMIDE 10 MG/ML (PF) SYRINGE
PREFILLED_SYRINGE | INTRAVENOUS | Status: AC
  Filled 2018-03-12: qty 10

## 2018-03-12 MED ORDER — VANCOMYCIN HCL IN DEXTROSE 1-5 GM/200ML-% IV SOLN
1000.0000 mg | Freq: Once | INTRAVENOUS | Status: AC
  Administered 2018-03-12: 1000 mg via INTRAVENOUS
  Filled 2018-03-12: qty 200

## 2018-03-12 MED ORDER — PROTAMINE SULFATE 10 MG/ML IV SOLN
INTRAVENOUS | Status: DC | PRN
  Administered 2018-03-12: 210 mg via INTRAVENOUS

## 2018-03-12 MED ORDER — SODIUM CHLORIDE 0.9% FLUSH
3.0000 mL | Freq: Two times a day (BID) | INTRAVENOUS | Status: DC
  Administered 2018-03-13 – 2018-03-14 (×2): 3 mL via INTRAVENOUS

## 2018-03-12 MED ORDER — DEXMEDETOMIDINE HCL IN NACL 200 MCG/50ML IV SOLN: 0.0000 ug/kg/h | INTRAVENOUS | Status: DC

## 2018-03-12 MED ORDER — FENTANYL CITRATE (PF) 250 MCG/5ML IJ SOLN
INTRAMUSCULAR | Status: AC
Start: 2018-03-12 — End: ?
  Filled 2018-03-12: qty 25

## 2018-03-12 MED ORDER — SODIUM CHLORIDE 0.9 % IV SOLN
INTRAVENOUS | Status: DC
  Administered 2018-03-12: 20 mL/h via INTRAVENOUS

## 2018-03-12 SURGICAL SUPPLY — 67 items
BAG DECANTER FOR FLEXI CONT (MISCELLANEOUS) ×3 IMPLANT
BANDAGE ACE 4X5 VEL STRL LF (GAUZE/BANDAGES/DRESSINGS) ×3 IMPLANT
BANDAGE ACE 6X5 VEL STRL LF (GAUZE/BANDAGES/DRESSINGS) ×3 IMPLANT
BLADE STERNUM SYSTEM 6 (BLADE) ×3 IMPLANT
BNDG GAUZE ELAST 4 BULKY (GAUZE/BANDAGES/DRESSINGS) ×3 IMPLANT
CANISTER SUCT 3000ML PPV (MISCELLANEOUS) ×3 IMPLANT
CANNULA VEN 2 STAGE (MISCELLANEOUS) ×3 IMPLANT
CATH CPB KIT GERHARDT (MISCELLANEOUS) ×3 IMPLANT
CATH THORACIC 28FR (CATHETERS) ×3 IMPLANT
CRADLE DONUT ADULT HEAD (MISCELLANEOUS) ×3 IMPLANT
DRAIN CHANNEL 28F RND 3/8 FF (WOUND CARE) ×3 IMPLANT
DRAPE CARDIOVASCULAR INCISE (DRAPES) ×1
DRAPE SLUSH/WARMER DISC (DRAPES) ×3 IMPLANT
DRAPE SRG 135X102X78XABS (DRAPES) ×2 IMPLANT
DRSG AQUACEL AG ADV 3.5X14 (GAUZE/BANDAGES/DRESSINGS) ×3 IMPLANT
ELECT BLADE 4.0 EZ CLEAN MEGAD (MISCELLANEOUS) ×3
ELECT REM PT RETURN 9FT ADLT (ELECTROSURGICAL) ×6
ELECTRODE BLDE 4.0 EZ CLN MEGD (MISCELLANEOUS) ×2 IMPLANT
ELECTRODE REM PT RTRN 9FT ADLT (ELECTROSURGICAL) ×4 IMPLANT
FELT TEFLON 1X6 (MISCELLANEOUS) ×3 IMPLANT
FLOSEAL 10ML (HEMOSTASIS) ×3 IMPLANT
GAUZE SPONGE 4X4 12PLY STRL (GAUZE/BANDAGES/DRESSINGS) ×6 IMPLANT
GLOVE BIO SURGEON STRL SZ 6.5 (GLOVE) ×24 IMPLANT
GLOVE BIO SURGEON STRL SZ7.5 (GLOVE) ×6 IMPLANT
GOWN STRL REUS W/ TWL LRG LVL3 (GOWN DISPOSABLE) ×12 IMPLANT
GOWN STRL REUS W/TWL LRG LVL3 (GOWN DISPOSABLE) ×6
HEMOSTAT POWDER SURGIFOAM 1G (HEMOSTASIS) IMPLANT
HEMOSTAT SURGICEL 2X14 (HEMOSTASIS) ×3 IMPLANT
KIT BASIN OR (CUSTOM PROCEDURE TRAY) ×3 IMPLANT
KIT CATH SUCT 8FR (CATHETERS) ×3 IMPLANT
KIT SUCTION CATH 14FR (SUCTIONS) ×6 IMPLANT
KIT TURNOVER KIT B (KITS) ×3 IMPLANT
KIT VASOVIEW HEMOPRO VH 3000 (KITS) ×3 IMPLANT
LEAD PACING MYOCARDI (MISCELLANEOUS) ×3 IMPLANT
MARKER GRAFT CORONARY BYPASS (MISCELLANEOUS) ×9 IMPLANT
NS IRRIG 1000ML POUR BTL (IV SOLUTION) ×15 IMPLANT
PACK E OPEN HEART (SUTURE) ×3 IMPLANT
PACK OPEN HEART (CUSTOM PROCEDURE TRAY) ×3 IMPLANT
PAD ARMBOARD 7.5X6 YLW CONV (MISCELLANEOUS) ×6 IMPLANT
PAD ELECT DEFIB RADIOL ZOLL (MISCELLANEOUS) ×3 IMPLANT
PENCIL BUTTON HOLSTER BLD 10FT (ELECTRODE) ×3 IMPLANT
POWDER SURGICEL 3.0 GRAM (HEMOSTASIS) ×3 IMPLANT
PUNCH AORTIC ROTATE  4.5MM 8IN (MISCELLANEOUS) ×3 IMPLANT
SET CARDIOPLEGIA MPS 5001102 (MISCELLANEOUS) ×3 IMPLANT
SLEEVE SURGEON STRL (DRAPES) ×3 IMPLANT
SPONGE LAP 18X18 X RAY DECT (DISPOSABLE) ×12 IMPLANT
SUT BONE WAX W31G (SUTURE) ×3 IMPLANT
SUT PROLENE 3 0 SH1 36 (SUTURE) ×6 IMPLANT
SUT PROLENE 4 0 TF (SUTURE) ×6 IMPLANT
SUT PROLENE 6 0 CC (SUTURE) ×15 IMPLANT
SUT PROLENE 7 0 BV1 MDA (SUTURE) ×3 IMPLANT
SUT PROLENE 8 0 BV175 6 (SUTURE) ×24 IMPLANT
SUT STEEL 6MS V (SUTURE) ×6 IMPLANT
SUT STEEL SZ 6 DBL 3X14 BALL (SUTURE) ×3 IMPLANT
SUT VIC AB 1 CTX 18 (SUTURE) ×6 IMPLANT
SUT VIC AB 2-0 CT1 27 (SUTURE) ×1
SUT VIC AB 2-0 CT1 TAPERPNT 27 (SUTURE) ×2 IMPLANT
SUT VIC AB 3-0 X1 27 (SUTURE) ×3 IMPLANT
SYSTEM SAHARA CHEST DRAIN ATS (WOUND CARE) ×3 IMPLANT
TAPE CLOTH SURG 4X10 WHT LF (GAUZE/BANDAGES/DRESSINGS) ×6 IMPLANT
TAPE PAPER 2X10 WHT MICROPORE (GAUZE/BANDAGES/DRESSINGS) ×3 IMPLANT
TOWEL GREEN STERILE (TOWEL DISPOSABLE) ×3 IMPLANT
TOWEL GREEN STERILE FF (TOWEL DISPOSABLE) ×3 IMPLANT
TRAY FOLEY SLVR 16FR TEMP STAT (SET/KITS/TRAYS/PACK) ×3 IMPLANT
TUBING INSUFFLATION (TUBING) ×3 IMPLANT
UNDERPAD 30X30 (UNDERPADS AND DIAPERS) ×3 IMPLANT
WATER STERILE IRR 1000ML POUR (IV SOLUTION) ×6 IMPLANT

## 2018-03-12 NOTE — Brief Op Note (Signed)
      [address]       [name] [address]             [phone]     03/10/2018 - 03/12/2018  12:50 PM  PATIENT:  [name]  57 y.o. male  PRE-OPERATIVE DIAGNOSIS:  CAD  POST-OPERATIVE DIAGNOSIS:  CAD  PROCEDURE:  Procedure(s): CORONARY ARTERY BYPASS GRAFTING (CABG) x 4 WITH ENODOSCOPIC HARVESTING OF RIGHT SAPHENOUS VEIN.  LIMA TO LAD, SVG TO DIAGONAL, SEQUENTIAL SVG TO INTERMEDIUS RAMUS AND DISTAL CIRCUMFLEX (N/A) TRANSESOPHAGEAL ECHOCARDIOGRAM (TEE) (N/A) LIMA-LAD SVG- -DIAG2 SVG-RAMUS-DISTAL CX  SURGEON:  Surgeon(s) and Role:    * [name], MD - Primary  PHYSICIAN ASSISTANT: WAYNE GOLD PA-C  ANESTHESIA:   general  EBL: 900   BLOOD ADMINISTERED:none  DRAINS: PLEURAL AND PERICARDIAL CHEST DRAINS   LOCAL MEDICATIONS USED:  NONE  SPECIMEN:  No Specimen  DISPOSITION OF SPECIMEN:  N/A  COUNTS:  YES   DICTATION: .Other Dictation: Dictation Number PENDING  PLAN OF CARE: Admit to inpatient   PATIENT DISPOSITION:  ICU - intubated and hemodynamically stable.   Delay start of Pharmacological VTE agent (>24hrs) due to surgical blood loss or risk of bleeding: yes

## 2018-03-12 NOTE — Anesthesia Postprocedure Evaluation (Signed)
Anesthesia Post Note  Patient: Diplomatic Services operational officer  Procedure(s) Performed: CORONARY ARTERY BYPASS GRAFTING (CABG) x 4 WITH ENODOSCOPIC HARVESTING OF RIGHT SAPHENOUS VEIN.  LIMA TO LAD, SVG TO DIAGONAL, SEQUENTIAL SVG TO INTERMEDIUS RAMUS AND DISTAL CIRCUMFLEX (N/A Chest) TRANSESOPHAGEAL ECHOCARDIOGRAM (TEE) (N/A )     Patient location during evaluation: SICU Anesthesia Type: General Level of consciousness: patient cooperative and patient remains intubated per anesthesia plan Pain management: pain level controlled Vital Signs Assessment: post-procedure vital signs reviewed and stable Respiratory status: patient remains intubated per anesthesia plan, spontaneous breathing and patient on ventilator - see flowsheet for VS Cardiovascular status: stable Postop Assessment: no apparent nausea or vomiting Anesthetic complications: no    Last Vitals:  Vitals:   03/12/18 1945 03/12/18 2000  BP: (!) 100/37 (!) 98/44  Pulse:    Resp: (!) 8 (!) 4  Temp: 37.2 C 37.2 C  SpO2:  100%    Last Pain:  Vitals:   03/12/18 0535  TempSrc: Axillary  PainSc:                  Eris Hannan COKER

## 2018-03-12 NOTE — OR Nursing (Signed)
1212 40 minute call made to SICU Charge Nurse.

## 2018-03-12 NOTE — Anesthesia Procedure Notes (Signed)
Arterial Line Insertion Start/End5/24/2019 6:50 AM, 03/12/2018 6:55 AM Performed by: Jeani Hawking, CRNA  Patient location: OOR procedure area. Preanesthetic checklist: patient identified, IV checked, site marked, risks and benefits discussed, surgical consent, monitors and equipment checked, pre-op evaluation, timeout performed and anesthesia consent Lidocaine 1% used for infiltration Left, radial was placed Catheter size: 20 G Maximum sterile barriers used   Attempts: 1 Procedure performed without using ultrasound guided technique. Following insertion, dressing applied and Biopatch. Post procedure assessment: unchanged  Patient tolerated the procedure well with no immediate complications.

## 2018-03-12 NOTE — Anesthesia Preprocedure Evaluation (Signed)
Anesthesia Evaluation  Patient identified by MRN, date of birth, ID band Patient awake    Reviewed: Allergy & Precautions, NPO status , Patient's Chart, lab work & pertinent test results  Airway Mallampati: II  TM Distance: >3 FB Neck ROM: Full    Dental  (+) Teeth Intact, Dental Advisory Given   Pulmonary    breath sounds clear to auscultation       Cardiovascular hypertension,  Rhythm:Regular Rate:Normal     Neuro/Psych    GI/Hepatic   Endo/Other  diabetes  Renal/GU      Musculoskeletal   Abdominal   Peds  Hematology   Anesthesia Other Findings   Reproductive/Obstetrics                             Anesthesia Physical Anesthesia Plan  ASA: III  Anesthesia Plan: General   Post-op Pain Management:    Induction: Intravenous  PONV Risk Score and Plan:   Airway Management Planned: Oral ETT  Additional Equipment: Arterial line, PA Cath, 3D TEE and Ultrasound Guidance Line Placement  Intra-op Plan:   Post-operative Plan: Post-operative intubation/ventilation  Informed Consent: I have reviewed the patients History and Physical, chart, labs and discussed the procedure including the risks, benefits and alternatives for the proposed anesthesia with the patient or authorized representative who has indicated his/her understanding and acceptance.   Dental advisory given  Plan Discussed with: CRNA and Anesthesiologist  Anesthesia Plan Comments:         Anesthesia Quick Evaluation

## 2018-03-12 NOTE — Addendum Note (Signed)
Addendum  created 03/12/18 2138 by Kipp Brood, MD   Diagnosis association updated

## 2018-03-12 NOTE — Anesthesia Procedure Notes (Signed)
Arterial Line Insertion Start/End5/24/2019 7:50 AM, 03/12/2018 7:05 AM Performed by: Kipp Brood, MD, anesthesiologist  Preanesthetic checklist: patient identified, IV checked, site marked, risks and benefits discussed, surgical consent, monitors and equipment checked, pre-op evaluation and timeout performed Left, radial was placed Hand hygiene performed   Attempts: 1 Procedure performed without using ultrasound guided technique. Following insertion, dressing applied and Biopatch. Post procedure assessment: normal  Patient tolerated the procedure well with no immediate complications.

## 2018-03-12 NOTE — Progress Notes (Signed)
  Echocardiogram Echocardiogram Transesophageal has been performed.  Leta Jungling M 03/12/2018, 8:48 AM

## 2018-03-12 NOTE — Anesthesia Procedure Notes (Signed)
Central Venous Catheter Insertion Performed by: Kipp Brood, MD, anesthesiologist Start/End5/24/2019 6:50 AM, 03/12/2018 7:00 AM Patient location: Pre-op. Preanesthetic checklist: patient identified, IV checked, site marked, risks and benefits discussed, surgical consent, monitors and equipment checked, pre-op evaluation, timeout performed and anesthesia consent Lidocaine 1% used for infiltration and patient sedated Hand hygiene performed  and maximum sterile barriers used  Catheter size: 9 Fr Sheath introducer Procedure performed using ultrasound guided technique. Ultrasound Notes:anatomy identified, needle tip was noted to be adjacent to the nerve/plexus identified, no ultrasound evidence of intravascular and/or intraneural injection and image(s) printed for medical record Attempts: 1 Following insertion, line sutured and dressing applied. Post procedure assessment: blood return through all ports, free fluid flow and no air  Patient tolerated the procedure well with no immediate complications.

## 2018-03-12 NOTE — Progress Notes (Signed)
Patient ID: Daniel Malone, male   DOB: 01-31-1961, 57 y.o.   MRN: [id number]  TCTS Evening Rounds:   Hemodynamically stable  CI = 2.1  Has started to wake up on vent but not ready for extubation yet.  Urine output good  CT output low  CBC    Component Value Date/Time   WBC 7.0 03/12/2018 1310   RBC 3.03 (L) 03/12/2018 1310   HGB 9.5 (L) 03/12/2018 1310   HGB 12.9 (L) 03/09/2018 1000   HCT 28.1 (L) 03/12/2018 1310   HCT 38.5 03/09/2018 1000   PLT 79 (L) 03/12/2018 1310   PLT 165 03/09/2018 1000   MCV 92.7 03/12/2018 1310   MCV 93 03/09/2018 1000   MCH 31.4 03/12/2018 1310   MCHC 33.8 03/12/2018 1310   RDW 12.5 03/12/2018 1310   RDW 14.4 03/09/2018 1000   LYMPHSABS 1.0 03/09/2018 1000   EOSABS 0.1 03/09/2018 1000   BASOSABS 0.0 03/09/2018 1000     BMET    Component Value Date/Time   NA 137 03/12/2018 0453   NA 137 03/09/2018 1000   K 5.4 (H) 03/12/2018 0453   CL 104 03/12/2018 0453   CO2 23 03/12/2018 0453   GLUCOSE 103 (H) 03/12/2018 0453   BUN 24 (H) 03/12/2018 0453   BUN 25 (H) 03/09/2018 1000   CREATININE 1.63 (H) 03/12/2018 0453   CALCIUM 9.6 03/12/2018 0453   GFRNONAA 45 (L) 03/12/2018 0453   GFRAA 52 (L) 03/12/2018 0453     A/P:  Stable postop course. Continue current plans. Extubate when ready.

## 2018-03-12 NOTE — Procedures (Signed)
Extubation Procedure Note  Patient Details:   Name: Daniel Malone DOB: 31-Oct-1960 MRN: [id number]   Airway Documentation:    Vent end date: 03/12/18 Vent end time: 2035   Evaluation  O2 sats: stable throughout Complications: No apparent complications Patient did tolerate procedure well. Bilateral Breath Sounds: Clear, Diminished   Yes   Patient tolerated rapid wean protocol. VC 2.1 L and NIF -40. Positive for cuff leak. Patient extubated to a 2 Lpm nasal cannula. No signs of dyspnea or stridor noted. Patient instructed on the Incentive Spirometer achieving 2000 mL three times. RN at bedside. Patient resting comfortably.   Ancil Boozer 03/12/2018, 9:05 PM

## 2018-03-12 NOTE — OR Nursing (Signed)
1303 Rolling call made to SICU.

## 2018-03-12 NOTE — Anesthesia Procedure Notes (Signed)
Central Venous Catheter Insertion Performed by: Kipp Brood, MD, anesthesiologist Start/End5/24/2019 6:50 AM, 03/12/2018 7:00 AM Patient location: Pre-op. Preanesthetic checklist: patient identified, IV checked, site marked, risks and benefits discussed, surgical consent, monitors and equipment checked, pre-op evaluation, timeout performed and anesthesia consent Hand hygiene performed  and maximum sterile barriers used  PA cath was placed.Swan type:thermodilution Procedure performed without using ultrasound guided technique. Attempts: 1 Patient tolerated the procedure well with no immediate complications.

## 2018-03-12 NOTE — Transfer of Care (Signed)
Immediate Anesthesia Transfer of Care Note  Patient: Daniel Malone  Procedure(s) Performed: CORONARY ARTERY BYPASS GRAFTING (CABG) x 4 WITH ENODOSCOPIC HARVESTING OF RIGHT SAPHENOUS VEIN.  LIMA TO LAD, SVG TO DIAGONAL, SEQUENTIAL SVG TO INTERMEDIUS RAMUS AND DISTAL CIRCUMFLEX (N/A Chest) TRANSESOPHAGEAL ECHOCARDIOGRAM (TEE) (N/A )  Patient Location: SICU  Anesthesia Type:General  Level of Consciousness: sedated and Patient remains intubated per anesthesia plan  Airway & Oxygen Therapy: Patient remains intubated per anesthesia plan and Patient placed on Ventilator (see vital sign flow sheet for setting)  Post-op Assessment: Report given to RN and Post -op Vital signs reviewed and stable  Post vital signs: Reviewed and stable  Last Vitals:  Vitals Value Taken Time  BP    Temp 34.8 C 03/12/2018  1:25 PM  Pulse 89 03/12/2018  1:25 PM  Resp 12 03/12/2018  1:25 PM  SpO2 100 % 03/12/2018  1:25 PM  Vitals shown include unvalidated device data.  Last Pain:  Vitals:   03/12/18 0535  TempSrc: Axillary  PainSc:       Patients Stated Pain Goal: 0 (03/11/18 2140)  Complications: No apparent anesthesia complications

## 2018-03-12 NOTE — OR Nursing (Signed)
1238 20 Minute call made to SICU.

## 2018-03-12 NOTE — Progress Notes (Signed)
      [address]       [name] [address]             [phone]    Pre Procedure note for inpatients:   Daniel Malone has been scheduled for Procedure(s): CORONARY ARTERY BYPASS GRAFTING (CABG) (N/A) TRANSESOPHAGEAL ECHOCARDIOGRAM (TEE) (N/A) today. The various methods of treatment have been discussed with the patient. After consideration of the risks, benefits and treatment options the patient has consented to the planned procedure.   The patient has been seen and labs reviewed. There are no changes in the patient's condition to prevent proceeding with the planned procedure today.  Recent labs:  Lab Results  Component Value Date   WBC 6.8 03/12/2018   HGB 13.8 03/12/2018   HCT 41.9 03/12/2018   PLT 168 03/12/2018   GLUCOSE 103 (H) 03/12/2018   ALT 16 (L) 03/11/2018   AST 14 (L) 03/11/2018   NA 137 03/12/2018   K 5.4 (H) 03/12/2018   CL 104 03/12/2018   CREATININE 1.63 (H) 03/12/2018   BUN 24 (H) 03/12/2018   CO2 23 03/12/2018   INR 0.98 03/11/2018   HGBA1C 7.1 (H) 03/11/2018    [name], MD 03/12/2018 7:06 AM

## 2018-03-13 ENCOUNTER — Inpatient Hospital Stay (HOSPITAL_COMMUNITY): Payer: Self-pay

## 2018-03-13 DIAGNOSIS — Z951 Presence of aortocoronary bypass graft: Secondary | ICD-10-CM

## 2018-03-13 LAB — POCT I-STAT, CHEM 8
BUN: 25 mg/dL — ABNORMAL HIGH (ref 6–20)
CHLORIDE: 103 mmol/L (ref 101–111)
Calcium, Ion: 1.16 mmol/L (ref 1.15–1.40)
Creatinine, Ser: 1.3 mg/dL — ABNORMAL HIGH (ref 0.61–1.24)
GLUCOSE: 183 mg/dL — AB (ref 65–99)
HCT: 27 % — ABNORMAL LOW (ref 39.0–52.0)
HEMOGLOBIN: 9.2 g/dL — AB (ref 13.0–17.0)
POTASSIUM: 5.1 mmol/L (ref 3.5–5.1)
SODIUM: 137 mmol/L (ref 135–145)
TCO2: 23 mmol/L (ref 22–32)

## 2018-03-13 LAB — BASIC METABOLIC PANEL
ANION GAP: 6 (ref 5–15)
BUN: 20 mg/dL (ref 6–20)
CHLORIDE: 107 mmol/L (ref 101–111)
CO2: 23 mmol/L (ref 22–32)
Calcium: 7.5 mg/dL — ABNORMAL LOW (ref 8.9–10.3)
Creatinine, Ser: 1.4 mg/dL — ABNORMAL HIGH (ref 0.61–1.24)
GFR calc non Af Amer: 54 mL/min — ABNORMAL LOW (ref >60)
Glucose, Bld: 125 mg/dL — ABNORMAL HIGH (ref 65–99)
POTASSIUM: 5 mmol/L (ref 3.5–5.1)
Sodium: 136 mmol/L (ref 135–145)

## 2018-03-13 LAB — CBC
HCT: 27.3 % — ABNORMAL LOW (ref 39.0–52.0)
HCT: 28.7 % — ABNORMAL LOW (ref 39.0–52.0)
HEMOGLOBIN: 9 g/dL — AB (ref 13.0–17.0)
Hemoglobin: 9.3 g/dL — ABNORMAL LOW (ref 13.0–17.0)
MCH: 31 pg (ref 26.0–34.0)
MCH: 31.2 pg (ref 26.0–34.0)
MCHC: 33 g/dL (ref 30.0–36.0)
MCHC: 32.4 g/dL (ref 30.0–36.0)
MCV: 94.1 fL (ref 78.0–100.0)
MCV: 96.3 fL (ref 78.0–100.0)
PLATELETS: 95 10*3/uL — AB (ref 150–400)
Platelets: 91 10*3/uL — ABNORMAL LOW (ref 150–400)
RBC: 2.9 MIL/uL — AB (ref 4.22–5.81)
RBC: 2.98 MIL/uL — AB (ref 4.22–5.81)
RDW: 13.1 % (ref 11.5–15.5)
RDW: 13.6 % (ref 11.5–15.5)
WBC: 9.8 10*3/uL (ref 4.0–10.5)
WBC: 10.7 10*3/uL — ABNORMAL HIGH (ref 4.0–10.5)

## 2018-03-13 LAB — GLUCOSE, CAPILLARY
GLUCOSE-CAPILLARY: 118 mg/dL — AB (ref 65–99)
GLUCOSE-CAPILLARY: 114 mg/dL — AB (ref 65–99)
GLUCOSE-CAPILLARY: 110 mg/dL — AB (ref 65–99)
GLUCOSE-CAPILLARY: 115 mg/dL — AB (ref 65–99)
GLUCOSE-CAPILLARY: 113 mg/dL — AB (ref 65–99)
GLUCOSE-CAPILLARY: 188 mg/dL — AB (ref 65–99)
GLUCOSE-CAPILLARY: 162 mg/dL — AB (ref 65–99)
Glucose-Capillary: 116 mg/dL — ABNORMAL HIGH (ref 65–99)
Glucose-Capillary: 104 mg/dL — ABNORMAL HIGH (ref 65–99)
Glucose-Capillary: 106 mg/dL — ABNORMAL HIGH (ref 65–99)

## 2018-03-13 LAB — CREATININE, SERUM
CREATININE: 1.64 mg/dL — AB (ref 0.61–1.24)
GFR calc Af Amer: 52 mL/min — ABNORMAL LOW (ref >60)
GFR calc non Af Amer: 45 mL/min — ABNORMAL LOW (ref >60)

## 2018-03-13 LAB — MAGNESIUM
MAGNESIUM: 2.2 mg/dL (ref 1.7–2.4)
Magnesium: 2.2 mg/dL (ref 1.7–2.4)

## 2018-03-13 MED ORDER — SIMETHICONE 80 MG PO CHEW
80.0000 mg | CHEWABLE_TABLET | Freq: Four times a day (QID) | ORAL | Status: DC | PRN
  Administered 2018-03-13 – 2018-03-15 (×5): 80 mg via ORAL
  Filled 2018-03-13 (×5): qty 1

## 2018-03-13 MED ORDER — FUROSEMIDE 10 MG/ML IJ SOLN
40.0000 mg | Freq: Once | INTRAMUSCULAR | Status: AC
  Administered 2018-03-13: 40 mg via INTRAVENOUS
  Filled 2018-03-13: qty 4

## 2018-03-13 MED ORDER — DEXTROSE 10 % IV SOLN
INTRAVENOUS | Status: DC
  Administered 2018-03-13: 10 mL/h via INTRAVENOUS

## 2018-03-13 MED ORDER — INSULIN ASPART 100 UNIT/ML ~~LOC~~ SOLN
0.0000 [IU] | Freq: Three times a day (TID) | SUBCUTANEOUS | Status: DC
  Administered 2018-03-13: 4 [IU] via SUBCUTANEOUS
  Administered 2018-03-14: 2 [IU] via SUBCUTANEOUS
  Administered 2018-03-14: 4 [IU] via SUBCUTANEOUS
  Administered 2018-03-14: 2 [IU] via SUBCUTANEOUS

## 2018-03-13 NOTE — Progress Notes (Signed)
Patient ID: Daniel Malone, male   DOB: [birth year], 57 y.o.   MRN: [id number] TCTS Evening Rounds:  Hemodynamically stable in sinus rhythm Diuresing well BMET    Component Value Date/Time   NA 137 03/13/2018 1629   NA 137 03/09/2018 1000   K 5.1 03/13/2018 1629   CL 103 03/13/2018 1629   CO2 23 03/13/2018 0359   GLUCOSE 183 (H) 03/13/2018 1629   BUN 25 (H) 03/13/2018 1629   BUN 25 (H) 03/09/2018 1000   CREATININE 1.30 (H) 03/13/2018 1629   CALCIUM 7.5 (L) 03/13/2018 0359   GFRNONAA 54 (L) 03/13/2018 0359   GFRAA >60 03/13/2018 0359   CBC    Component Value Date/Time   WBC 9.8 03/13/2018 0359   RBC 2.90 (L) 03/13/2018 0359   HGB 9.2 (L) 03/13/2018 1629   HGB 12.9 (L) 03/09/2018 1000   HCT 27.0 (L) 03/13/2018 1629   HCT 38.5 03/09/2018 1000   PLT 91 (L) 03/13/2018 0359   PLT 165 03/09/2018 1000   MCV 94.1 03/13/2018 0359   MCV 93 03/09/2018 1000   MCH 31.0 03/13/2018 0359   MCHC 33.0 03/13/2018 0359   RDW 13.1 03/13/2018 0359   RDW 14.4 03/09/2018 1000   LYMPHSABS 1.0 03/09/2018 1000   EOSABS 0.1 03/09/2018 1000   BASOSABS 0.0 03/09/2018 1000

## 2018-03-13 NOTE — Progress Notes (Signed)
1 Day Post-Op Procedure(s) (LRB): CORONARY ARTERY BYPASS GRAFTING (CABG) x 4 WITH ENODOSCOPIC HARVESTING OF RIGHT SAPHENOUS VEIN.  LIMA TO LAD, SVG TO DIAGONAL, SEQUENTIAL SVG TO INTERMEDIUS RAMUS AND DISTAL CIRCUMFLEX (N/A) TRANSESOPHAGEAL ECHOCARDIOGRAM (TEE) (N/A) Subjective: No complaints  Objective: Vital signs in last 24 hours: Temp:  [94.3 F (34.6 C)-99.3 F (37.4 C)] 98.1 F (36.7 C) (05/25 1000) Pulse Rate:  [81-100] 100 (05/25 1000) Cardiac Rhythm: Normal sinus rhythm (05/25 0800) Resp:  [4-23] 13 (05/25 1000) BP: (78-130)/(37-92) 130/86 (05/25 1000) SpO2:  [100 %] 100 % (05/25 1000) Arterial Line BP: (87-155)/(51-81) 155/74 (05/25 1000) FiO2 (%):  [40 %-50 %] 40 % (05/24 2003) Weight:  [62.5 kg (137 lb 12.6 oz)] 62.5 kg (137 lb 12.6 oz) (05/25 0500)  Hemodynamic parameters for last 24 hours: PAP: (14-36)/(2-21) 27/13 CO:  [2.8 L/min-5 L/min] 5 L/min CI:  [1.7 L/min/m2-3.7 L/min/m2] 3 L/min/m2  Intake/Output from previous day: 05/24 0701 - 05/25 0700 In: 6056.6 [I.V.:3356.6; Blood:350; IV Piggyback:2350] Out: 4901 [Urine:3945; Blood:500; Chest Tube:456] Intake/Output this shift: Total I/O In: 76.8 [I.V.:76.8] Out: -   General appearance: alert and cooperative Neurologic: intact Heart: regular rate and rhythm, S1, S2 normal, no murmur, click, rub or gallop Lungs: clear to auscultation bilaterally Extremities: edema mild Wound: dressing dry  Lab Results: Recent Labs    03/12/18 1855 03/12/18 1943 03/13/18 0359  WBC 8.7  --  9.8  HGB 9.7* 8.5* 9.0*  HCT 28.8* 25.0* 27.3*  PLT 87*  --  91*   BMET:  Recent Labs    03/12/18 0453  03/12/18 1943 03/13/18 0359  NA 137   < > 139 136  K 5.4*   < > 4.6 5.0  CL 104  --  107 107  CO2 23  --   --  23  GLUCOSE 103*   < > 104* 125*  BUN 24*  --  18 20  CREATININE 1.63*   < > 1.20 1.40*  CALCIUM 9.6  --   --  7.5*   < > = values in this interval not displayed.    PT/INR:  Recent Labs    03/12/18 1310   LABPROT 17.8*  INR 1.48   ABG    Component Value Date/Time   PHART 7.304 (L) 03/12/2018 2131   HCO3 21.0 03/12/2018 2131   TCO2 22 03/12/2018 2131   ACIDBASEDEF 5.0 (H) 03/12/2018 2131   O2SAT 99.0 03/12/2018 2131   CBG (last 3)  Recent Labs    03/13/18 0755 03/13/18 0854 03/13/18 1012  GLUCAP 110* 115* 106*   CXR: clear  ECG: sinus, mild diffuse ST elevation consistent with pericarditis.  Assessment/Plan: S/P Procedure(s) (LRB): CORONARY ARTERY BYPASS GRAFTING (CABG) x 4 WITH ENODOSCOPIC HARVESTING OF RIGHT SAPHENOUS VEIN.  LIMA TO LAD, SVG TO DIAGONAL, SEQUENTIAL SVG TO INTERMEDIUS RAMUS AND DISTAL CIRCUMFLEX (N/A) TRANSESOPHAGEAL ECHOCARDIOGRAM (TEE) (N/A)  POD 1 Hemodynamically stable. DC swan and arterial line. Continue low dose beta blocker  DC chest tubes  Volume excess: diurese  Stage 3 CKD.  Follow  DM: preop Hgb A1c was 7.1.  Insulin drip off. 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    LOS: 3 days    Alleen Borne 03/13/2018

## 2018-03-14 ENCOUNTER — Inpatient Hospital Stay (HOSPITAL_COMMUNITY): Payer: Self-pay

## 2018-03-14 LAB — GLUCOSE, CAPILLARY
Glucose-Capillary: 176 mg/dL — ABNORMAL HIGH (ref 65–99)
Glucose-Capillary: 156 mg/dL — ABNORMAL HIGH (ref 65–99)
Glucose-Capillary: 156 mg/dL — ABNORMAL HIGH (ref 65–99)
Glucose-Capillary: 158 mg/dL — ABNORMAL HIGH (ref 65–99)

## 2018-03-14 LAB — CBC
HEMATOCRIT: 28.6 % — AB (ref 39.0–52.0)
Hemoglobin: 9.4 g/dL — ABNORMAL LOW (ref 13.0–17.0)
MCH: 31.4 pg (ref 26.0–34.0)
MCHC: 32.9 g/dL (ref 30.0–36.0)
MCV: 95.7 fL (ref 78.0–100.0)
Platelets: 91 10*3/uL — ABNORMAL LOW (ref 150–400)
RBC: 2.99 MIL/uL — ABNORMAL LOW (ref 4.22–5.81)
RDW: 13.4 % (ref 11.5–15.5)
WBC: 10.6 10*3/uL — ABNORMAL HIGH (ref 4.0–10.5)

## 2018-03-14 LAB — BASIC METABOLIC PANEL
Anion gap: 6 (ref 5–15)
BUN: 24 mg/dL — AB (ref 6–20)
CHLORIDE: 104 mmol/L (ref 101–111)
CO2: 22 mmol/L (ref 22–32)
Calcium: 8.2 mg/dL — ABNORMAL LOW (ref 8.9–10.3)
Creatinine, Ser: 1.46 mg/dL — ABNORMAL HIGH (ref 0.61–1.24)
GFR calc Af Amer: 60 mL/min — ABNORMAL LOW (ref >60)
GFR calc non Af Amer: 52 mL/min — ABNORMAL LOW (ref >60)
Glucose, Bld: 174 mg/dL — ABNORMAL HIGH (ref 65–99)
POTASSIUM: 5.4 mmol/L — AB (ref 3.5–5.1)
Sodium: 132 mmol/L — ABNORMAL LOW (ref 135–145)

## 2018-03-14 MED ORDER — INSULIN ASPART 100 UNIT/ML ~~LOC~~ SOLN
0.0000 [IU] | Freq: Three times a day (TID) | SUBCUTANEOUS | Status: DC
  Administered 2018-03-14: 2 [IU] via SUBCUTANEOUS
  Administered 2018-03-15: 6 [IU] via SUBCUTANEOUS
  Administered 2018-03-15: 4 [IU] via SUBCUTANEOUS
  Administered 2018-03-15: 2 [IU] via SUBCUTANEOUS
  Administered 2018-03-15 – 2018-03-16 (×3): 4 [IU] via SUBCUTANEOUS
  Administered 2018-03-16 – 2018-03-17 (×3): 2 [IU] via SUBCUTANEOUS

## 2018-03-14 MED ORDER — DOCUSATE SODIUM 100 MG PO CAPS
200.0000 mg | ORAL_CAPSULE | Freq: Every day | ORAL | Status: DC
  Administered 2018-03-14 – 2018-03-16 (×3): 200 mg via ORAL
  Filled 2018-03-14 (×4): qty 2

## 2018-03-14 MED ORDER — SODIUM CHLORIDE 0.9 % IV SOLN: 250.0000 mL | INTRAVENOUS | Status: DC | PRN

## 2018-03-14 MED ORDER — ACETAMINOPHEN 325 MG PO TABS
650.0000 mg | ORAL_TABLET | Freq: Four times a day (QID) | ORAL | Status: DC | PRN
  Administered 2018-03-14 – 2018-03-16 (×2): 650 mg via ORAL
  Filled 2018-03-14 (×2): qty 2

## 2018-03-14 MED ORDER — SODIUM CHLORIDE 0.9% FLUSH
3.0000 mL | Freq: Two times a day (BID) | INTRAVENOUS | Status: DC
  Administered 2018-03-14 – 2018-03-16 (×6): 3 mL via INTRAVENOUS

## 2018-03-14 MED ORDER — PANTOPRAZOLE SODIUM 40 MG PO TBEC
40.0000 mg | DELAYED_RELEASE_TABLET | Freq: Every day | ORAL | Status: DC
  Administered 2018-03-15 – 2018-03-17 (×3): 40 mg via ORAL
  Filled 2018-03-14 (×3): qty 1

## 2018-03-14 MED ORDER — OXYCODONE HCL 5 MG PO TABS: 5.0000 mg | ORAL_TABLET | ORAL | Status: DC | PRN

## 2018-03-14 MED ORDER — ASPIRIN EC 325 MG PO TBEC
325.0000 mg | DELAYED_RELEASE_TABLET | Freq: Every day | ORAL | Status: DC
  Administered 2018-03-14 – 2018-03-17 (×4): 325 mg via ORAL
  Filled 2018-03-14 (×4): qty 1

## 2018-03-14 MED ORDER — ONDANSETRON HCL 4 MG PO TABS: 4.0000 mg | ORAL_TABLET | Freq: Four times a day (QID) | ORAL | Status: DC | PRN

## 2018-03-14 MED ORDER — ONDANSETRON HCL 4 MG/2ML IJ SOLN
4.0000 mg | Freq: Four times a day (QID) | INTRAMUSCULAR | Status: DC | PRN
Start: 2018-03-14 — End: 2018-03-17

## 2018-03-14 MED ORDER — TRAMADOL HCL 50 MG PO TABS: 50.0000 mg | ORAL_TABLET | Freq: Four times a day (QID) | ORAL | Status: DC | PRN

## 2018-03-14 MED ORDER — MOVING RIGHT ALONG BOOK
Freq: Once | Status: AC
  Administered 2018-03-15: 06:00:00
  Filled 2018-03-14: qty 1

## 2018-03-14 MED ORDER — METOPROLOL TARTRATE 12.5 MG HALF TABLET
12.5000 mg | ORAL_TABLET | Freq: Two times a day (BID) | ORAL | Status: DC
  Administered 2018-03-14 – 2018-03-15 (×3): 12.5 mg via ORAL
  Filled 2018-03-14 (×3): qty 1

## 2018-03-14 MED ORDER — SODIUM CHLORIDE 0.9% FLUSH: 3.0000 mL | INTRAVENOUS | Status: DC | PRN

## 2018-03-14 MED ORDER — GLIPIZIDE ER 10 MG PO TB24
20.0000 mg | ORAL_TABLET | Freq: Every day | ORAL | Status: DC
  Administered 2018-03-15 – 2018-03-17 (×3): 20 mg via ORAL
  Filled 2018-03-14 (×3): qty 2

## 2018-03-14 MED ORDER — BISACODYL 10 MG RE SUPP: 10.0000 mg | Freq: Every day | RECTAL | Status: DC | PRN

## 2018-03-14 MED ORDER — BISACODYL 5 MG PO TBEC
10.0000 mg | DELAYED_RELEASE_TABLET | Freq: Every day | ORAL | Status: DC | PRN
  Filled 2018-03-14: qty 2

## 2018-03-14 NOTE — Progress Notes (Signed)
Anesthesiology Note:  Awake and alert, sitting in chair, minimal pain, walking around the SICU.  VS: T- 36.6 BP- 133/86 HR- 100 (SR) RR- 12 O2 sat 98% on RA  CXR shows small R. apical pneumothorax  K-5.4 glucose- 157 BUN/Cr 24/1.46 H/H-  9.4/28.6 Platelets- 25,75  57 year old male 2 days S/P CABG X 4. Longstanding DM with renal insufficiency and family history of MELAS syndrome. Stable post-op course, mild hyperkalemia, potassium removed from IV fluids. Small R. Apical pneumothorax, observation for now.  Kipp Brood

## 2018-03-14 NOTE — Progress Notes (Signed)
2 Days Post-Op Procedure(s) (LRB): CORONARY ARTERY BYPASS GRAFTING (CABG) x 4 WITH ENODOSCOPIC HARVESTING OF RIGHT SAPHENOUS VEIN.  LIMA TO LAD, SVG TO DIAGONAL, SEQUENTIAL SVG TO INTERMEDIUS RAMUS AND DISTAL CIRCUMFLEX (N/A) TRANSESOPHAGEAL ECHOCARDIOGRAM (TEE) (N/A) Subjective: Some LUQ pain that feels like gas. No BM yet. Ambulated today.  Objective: Vital signs in last 24 hours: Temp:  [98.6 F (37 C)-98.9 F (37.2 C)] 98.7 F (37.1 C) (05/26 1100) Pulse Rate:  [77-105] 94 (05/26 1000) Cardiac Rhythm: Normal sinus rhythm (05/26 0749) Resp:  [10-20] 14 (05/26 1000) BP: (92-149)/(67-101) 133/94 (05/26 1000) SpO2:  [94 %-100 %] 98 % (05/26 1000) Weight:  [62 kg (136 lb 11 oz)] 62 kg (136 lb 11 oz) (05/26 0400)  Hemodynamic parameters for last 24 hours:    Intake/Output from previous day: 05/25 0701 - 05/26 0700 In: 1038.9 [P.O.:480; I.V.:458.9; IV Piggyback:100] Out: 1635 [Urine:1575; Chest Tube:60] Intake/Output this shift: Total I/O In: 220 [P.O.:120; IV Piggyback:100] Out: 275 [Urine:275]  General appearance: alert and cooperative Neurologic: intact Heart: regular rate and rhythm, S1, S2 normal, no murmur, click, rub or gallop Lungs: clear to auscultation bilaterally Abdomen: soft, non-tender; bowel sounds normal; no masses,  no organomegaly Extremities: extremities normal, atraumatic, no cyanosis or edema Wound: dressing dry  Lab Results: Recent Labs    03/13/18 2028 03/14/18 0406  WBC 10.7* 10.6*  HGB 9.3* 9.4*  HCT 28.7* 28.6*  PLT 95* 91*   BMET:  Recent Labs    03/13/18 0359 03/13/18 1629 03/13/18 2028 03/14/18 0406  NA 136 137  --  132*  K 5.0 5.1  --  5.4*  CL 107 103  --  104  CO2 23  --   --  22  GLUCOSE 125* 183*  --  174*  BUN 20 25*  --  24*  CREATININE 1.40* 1.30* 1.64* 1.46*  CALCIUM 7.5*  --   --  8.2*    PT/INR:  Recent Labs    03/12/18 1310  LABPROT 17.8*  INR 1.48   ABG    Component Value Date/Time   PHART 7.304 (L)  03/12/2018 2131   HCO3 21.0 03/12/2018 2131   TCO2 23 03/13/2018 1629   ACIDBASEDEF 5.0 (H) 03/12/2018 2131   O2SAT 99.0 03/12/2018 2131   CBG (last 3)  Recent Labs    03/13/18 2133 03/14/18 0700 03/14/18 1125  GLUCAP 162* 176* 156*   CLINICAL DATA:  Status post coronary artery bypass graft.  EXAM: PORTABLE CHEST 1 VIEW  COMPARISON:  Radiograph of Mar 13, 2018.  FINDINGS: Stable cardiomediastinal silhouette. Sternotomy wires are noted. Left-sided chest tube has been removed. No definite pneumothorax is noted on the left. Minimal left basilar subsegmental atelectasis is noted. Right internal jugular sheath is noted. However, there is interval development of small right apical pneumothorax. Bony thorax is unremarkable.  IMPRESSION: Interval development of small right apical pneumothorax. These results will be called to the ordering clinician or representative by the Radiologist Assistant, and communication documented in the PACS or zVision Dashboard.   Electronically Signed   By: Lupita Raider, M.D.   On: 03/14/2018 08:28  Assessment/Plan: S/P Procedure(s) (LRB): CORONARY ARTERY BYPASS GRAFTING (CABG) x 4 WITH ENODOSCOPIC HARVESTING OF RIGHT SAPHENOUS VEIN.  LIMA TO LAD, SVG TO DIAGONAL, SEQUENTIAL SVG TO INTERMEDIUS RAMUS AND DISTAL CIRCUMFLEX (N/A) TRANSESOPHAGEAL ECHOCARDIOGRAM (TEE) (N/A)  POD 2 Hemodynamically stable in sinus rhythm. CXR shows small right apical PTX. Will repeat in am. Stage 3 CKD. Follow. Weight is still about  12 lbs over preop. Continue diuresis. No KCL due to standing hyperkalemia. DM: glucose under reasonable control. Will resume Glucotrol tomorrow. 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 DC sleeve and foley. Transfer to 4E and continue ambulation and IS.     LOS: 4 days    Alleen Borne 03/14/2018

## 2018-03-14 NOTE — Plan of Care (Signed)
  Problem: Activity: Goal: Ability to return to baseline activity level will improve Outcome: Progressing   Problem: Cardiovascular: Goal: Ability to achieve and maintain adequate cardiovascular perfusion will improve Outcome: Progressing   Problem: Activity: Goal: Risk for activity intolerance will decrease Outcome: Progressing   Problem: Cardiac: Goal: Hemodynamic stability will improve Outcome: Progressing   Problem: Respiratory: Goal: Respiratory status will improve Outcome: Progressing

## 2018-03-14 NOTE — Progress Notes (Signed)
Spoke with Millie Rn re new order to d/c CVC.  She is aware and will d/c.

## 2018-03-15 ENCOUNTER — Inpatient Hospital Stay (HOSPITAL_COMMUNITY): Payer: Self-pay

## 2018-03-15 ENCOUNTER — Encounter (HOSPITAL_COMMUNITY): Payer: Self-pay | Admitting: Cardiothoracic Surgery

## 2018-03-15 LAB — GLUCOSE, CAPILLARY
GLUCOSE-CAPILLARY: 189 mg/dL — AB (ref 65–99)
GLUCOSE-CAPILLARY: 132 mg/dL — AB (ref 65–99)
Glucose-Capillary: 192 mg/dL — ABNORMAL HIGH (ref 65–99)
Glucose-Capillary: 172 mg/dL — ABNORMAL HIGH (ref 65–99)

## 2018-03-15 LAB — BASIC METABOLIC PANEL
Anion gap: 8 (ref 5–15)
BUN: 24 mg/dL — AB (ref 6–20)
CALCIUM: 8.7 mg/dL — AB (ref 8.9–10.3)
CO2: 24 mmol/L (ref 22–32)
Chloride: 104 mmol/L (ref 101–111)
Creatinine, Ser: 1.41 mg/dL — ABNORMAL HIGH (ref 0.61–1.24)
GFR calc Af Amer: >60 mL/min (ref >60)
GFR, EST NON AFRICAN AMERICAN: 54 mL/min — AB (ref >60)
Glucose, Bld: 120 mg/dL — ABNORMAL HIGH (ref 65–99)
POTASSIUM: 4.5 mmol/L (ref 3.5–5.1)
SODIUM: 136 mmol/L (ref 135–145)

## 2018-03-15 LAB — CBC
HCT: 28.1 % — ABNORMAL LOW (ref 39.0–52.0)
Hemoglobin: 9.2 g/dL — ABNORMAL LOW (ref 13.0–17.0)
MCH: 30.8 pg (ref 26.0–34.0)
MCHC: 32.7 g/dL (ref 30.0–36.0)
MCV: 94 fL (ref 78.0–100.0)
PLATELETS: 101 10*3/uL — AB (ref 150–400)
RBC: 2.99 MIL/uL — AB (ref 4.22–5.81)
RDW: 13 % (ref 11.5–15.5)
WBC: 9.3 10*3/uL (ref 4.0–10.5)

## 2018-03-15 MED ORDER — LACTULOSE 10 GM/15ML PO SOLN
10.0000 g | Freq: Every day | ORAL | Status: DC | PRN
  Administered 2018-03-15: 10 g via ORAL
  Filled 2018-03-15: qty 15

## 2018-03-15 NOTE — Progress Notes (Signed)
Received patient from 2 heart.  Mr. Nurse is awake, alert and oriented x 4.  Denies pain at this time.  VSS.  Ambulating in room with no difficulty.  Oriented to room, and department.  Verbalized understanding.  Call placed to CCMD and placed patient on cardiac monitor.

## 2018-03-15 NOTE — Progress Notes (Signed)
3 Days Post-Op Procedure(s) (LRB): CORONARY ARTERY BYPASS GRAFTING (CABG) x 4 WITH ENODOSCOPIC HARVESTING OF RIGHT SAPHENOUS VEIN.  LIMA TO LAD, SVG TO DIAGONAL, SEQUENTIAL SVG TO INTERMEDIUS RAMUS AND DISTAL CIRCUMFLEX (N/A) TRANSESOPHAGEAL ECHOCARDIOGRAM (TEE) (N/A) Subjective: Complains of abdominal pains due to constipation. No BM since surgery.  Objective: Vital signs in last 24 hours: Temp:  [97.9 F (36.6 C)-98.7 F (37.1 C)] 98.2 F (36.8 C) (05/27 0833) Pulse Rate:  [96-127] 116 (05/27 1035) Cardiac Rhythm: Sinus tachycardia (05/27 0800) Resp:  [12-22] 22 (05/27 1000) BP: (92-158)/(56-97) 136/90 (05/27 1035) SpO2:  [95 %-100 %] 99 % (05/27 1000) Weight:  [59.9 kg (132 lb)] 59.9 kg (132 lb) (05/27 0627)  Hemodynamic parameters for last 24 hours:    Intake/Output from previous day: 05/26 0701 - 05/27 0700 In: 983 [P.O.:720; I.V.:163; IV Piggyback:100] Out: 1975 [Urine:1975] Intake/Output this shift: Total I/O In: 50 [P.O.:50] Out: 400 [Urine:400]  General appearance: alert and cooperative Heart: regular rate and rhythm, S1, S2 normal, no murmur, click, rub or gallop Lungs: clear to auscultation bilaterally Abdomen: soft, non-tender; bowel sounds normal; no masses,  no organomegaly Extremities: extremities normal, atraumatic, no cyanosis or edema Wound: incision ok  Lab Results: Recent Labs    03/14/18 0406 03/15/18 0213  WBC 10.6* 9.3  HGB 9.4* 9.2*  HCT 28.6* 28.1*  PLT 91* 101*   BMET:  Recent Labs    03/14/18 0406 03/15/18 0213  NA 132* 136  K 5.4* 4.5  CL 104 104  CO2 22 24  GLUCOSE 174* 120*  BUN 24* 24*  CREATININE 1.46* 1.41*  CALCIUM 8.2* 8.7*    PT/INR:  Recent Labs    03/12/18 1310  LABPROT 17.8*  INR 1.48   ABG    Component Value Date/Time   PHART 7.304 (L) 03/12/2018 2131   HCO3 21.0 03/12/2018 2131   TCO2 23 03/13/2018 1629   ACIDBASEDEF 5.0 (H) 03/12/2018 2131   O2SAT 99.0 03/12/2018 2131   CBG (last 3)  Recent Labs     03/14/18 1635 03/14/18 2234 03/15/18 0828  GLUCAP 156* 158* 192*   CLINICAL DATA:  3 days post op cabg  SOB this morning  EXAM: CHEST - 2 VIEW  COMPARISON:  03/14/2018  FINDINGS: The right IJ introducer sheath has been removed.  Stable 10% right apical pneumothorax.  Rounded radiopacity compatible with a button projects over the right lung base.  Prior CABG.  Epicardial pacer leads noted.  Small bilateral pleural effusions cause blunting of the posterior costophrenic angles.  IMPRESSION: 1. Stable 10% right apical pneumothorax. 2. Small bilateral pleural effusions. 3. Right-sided introducer sheath has been removed.   Electronically Signed   By: Gaylyn Rong M.D.   On: 03/15/2018 08:11  Assessment/Plan: S/P Procedure(s) (LRB): CORONARY ARTERY BYPASS GRAFTING (CABG) x 4 WITH ENODOSCOPIC HARVESTING OF RIGHT SAPHENOUS VEIN.  LIMA TO LAD, SVG TO DIAGONAL, SEQUENTIAL SVG TO INTERMEDIUS RAMUS AND DISTAL CIRCUMFLEX (N/A) TRANSESOPHAGEAL ECHOCARDIOGRAM (TEE) (N/A)  POD 3 Hemodynamically stable in sinus rhythm. CXR shows small right apical PTX that is stable. Stage 3 CKD. Follow. Weight is still about 7 lbs over preop. Hold on further diuresis until bowels get moving. No KCL due to standing hyperkalemia. DM: glucose under reasonable control. Glucotrol started. Lactulose for constipation. 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  Awaiting transfer to 4E and continue ambulation and IS.     LOS: 5 days    Alleen Borne 03/15/2018

## 2018-03-16 LAB — POCT I-STAT, CHEM 8
BUN: 23 mg/dL — ABNORMAL HIGH (ref 6–20)
BUN: 17 mg/dL (ref 6–20)
BUN: 22 mg/dL — ABNORMAL HIGH (ref 6–20)
BUN: 20 mg/dL (ref 6–20)
BUN: 19 mg/dL (ref 6–20)
CALCIUM ION: 1.3 mmol/L (ref 1.15–1.40)
CALCIUM ION: 0.8 mmol/L — AB (ref 1.15–1.40)
CALCIUM ION: 1.25 mmol/L (ref 1.15–1.40)
CHLORIDE: 104 mmol/L (ref 101–111)
CHLORIDE: 100 mmol/L — AB (ref 101–111)
CHLORIDE: 101 mmol/L (ref 101–111)
CREATININE: 0.8 mg/dL (ref 0.61–1.24)
CREATININE: 1.1 mg/dL (ref 0.61–1.24)
CREATININE: 1.1 mg/dL (ref 0.61–1.24)
CREATININE: 1 mg/dL (ref 0.61–1.24)
Calcium, Ion: 1.03 mmol/L — ABNORMAL LOW (ref 1.15–1.40)
Calcium, Ion: 0.99 mmol/L — ABNORMAL LOW (ref 1.15–1.40)
Chloride: 107 mmol/L (ref 101–111)
Chloride: 103 mmol/L (ref 101–111)
Creatinine, Ser: 1.2 mg/dL (ref 0.61–1.24)
GLUCOSE: 138 mg/dL — AB (ref 65–99)
GLUCOSE: 161 mg/dL — AB (ref 65–99)
GLUCOSE: 153 mg/dL — AB (ref 65–99)
GLUCOSE: 141 mg/dL — AB (ref 65–99)
Glucose, Bld: 127 mg/dL — ABNORMAL HIGH (ref 65–99)
HCT: 37 % — ABNORMAL LOW (ref 39.0–52.0)
HCT: 25 % — ABNORMAL LOW (ref 39.0–52.0)
HCT: 31 % — ABNORMAL LOW (ref 39.0–52.0)
HCT: 24 % — ABNORMAL LOW (ref 39.0–52.0)
HCT: 20 % — ABNORMAL LOW (ref 39.0–52.0)
HEMOGLOBIN: 10.5 g/dL — AB (ref 13.0–17.0)
HEMOGLOBIN: 6.8 g/dL — AB (ref 13.0–17.0)
Hemoglobin: 12.6 g/dL — ABNORMAL LOW (ref 13.0–17.0)
Hemoglobin: 8.5 g/dL — ABNORMAL LOW (ref 13.0–17.0)
Hemoglobin: 8.2 g/dL — ABNORMAL LOW (ref 13.0–17.0)
POTASSIUM: 5.7 mmol/L — AB (ref 3.5–5.1)
Potassium: 6.3 mmol/L (ref 3.5–5.1)
Potassium: 4.9 mmol/L (ref 3.5–5.1)
Potassium: 5.5 mmol/L — ABNORMAL HIGH (ref 3.5–5.1)
Potassium: 4.4 mmol/L (ref 3.5–5.1)
Sodium: 136 mmol/L (ref 135–145)
Sodium: 137 mmol/L (ref 135–145)
Sodium: 143 mmol/L (ref 135–145)
Sodium: 136 mmol/L (ref 135–145)
Sodium: 138 mmol/L (ref 135–145)
TCO2: 25 mmol/L (ref 22–32)
TCO2: 24 mmol/L (ref 22–32)
TCO2: 28 mmol/L (ref 22–32)
TCO2: 25 mmol/L (ref 22–32)
TCO2: 24 mmol/L (ref 22–32)

## 2018-03-16 LAB — POCT I-STAT 3, ART BLOOD GAS (G3+)
ACID-BASE DEFICIT: 3 mmol/L — AB (ref 0.0–2.0)
ACID-BASE DEFICIT: 5 mmol/L — AB (ref 0.0–2.0)
ACID-BASE DEFICIT: 2 mmol/L (ref 0.0–2.0)
Acid-Base Excess: 4 mmol/L — ABNORMAL HIGH (ref 0.0–2.0)
BICARBONATE: 28.3 mmol/L — AB (ref 20.0–28.0)
BICARBONATE: 22.6 mmol/L (ref 20.0–28.0)
Bicarbonate: 22.2 mmol/L (ref 20.0–28.0)
Bicarbonate: 22 mmol/L (ref 20.0–28.0)
O2 SAT: 100 %
O2 SAT: 100 %
O2 SAT: 100 %
O2 Saturation: 100 %
PCO2 ART: 41.2 mmHg (ref 32.0–48.0)
PCO2 ART: 49.4 mmHg — AB (ref 32.0–48.0)
PH ART: 7.342 — AB (ref 7.350–7.450)
PO2 ART: 572 mmHg — AB (ref 83.0–108.0)
TCO2: 23 mmol/L (ref 22–32)
TCO2: 23 mmol/L (ref 22–32)
TCO2: 30 mmol/L (ref 22–32)
TCO2: 24 mmol/L (ref 22–32)
pCO2 arterial: 40.9 mmHg (ref 32.0–48.0)
pCO2 arterial: 37 mmHg (ref 32.0–48.0)
pH, Arterial: 7.256 — ABNORMAL LOW (ref 7.350–7.450)
pH, Arterial: 7.445 (ref 7.350–7.450)
pH, Arterial: 7.394 (ref 7.350–7.450)
pO2, Arterial: 542 mmHg — ABNORMAL HIGH (ref 83.0–108.0)
pO2, Arterial: 567 mmHg — ABNORMAL HIGH (ref 83.0–108.0)
pO2, Arterial: 425 mmHg — ABNORMAL HIGH (ref 83.0–108.0)

## 2018-03-16 LAB — GLUCOSE, CAPILLARY
GLUCOSE-CAPILLARY: 111 mg/dL — AB (ref 65–99)
GLUCOSE-CAPILLARY: 121 mg/dL — AB (ref 65–99)
GLUCOSE-CAPILLARY: 152 mg/dL — AB (ref 65–99)
GLUCOSE-CAPILLARY: 193 mg/dL — AB (ref 65–99)
Glucose-Capillary: 123 mg/dL — ABNORMAL HIGH (ref 65–99)

## 2018-03-16 MED ORDER — METOPROLOL TARTRATE 25 MG PO TABS
25.0000 mg | ORAL_TABLET | Freq: Two times a day (BID) | ORAL | Status: DC
  Administered 2018-03-16 – 2018-03-17 (×3): 25 mg via ORAL
  Filled 2018-03-16 (×3): qty 1

## 2018-03-16 MED ORDER — LISINOPRIL 10 MG PO TABS: 10.0000 mg | ORAL_TABLET | Freq: Every day | ORAL | Status: DC

## 2018-03-16 MED ORDER — LINAGLIPTIN 5 MG PO TABS
5.0000 mg | ORAL_TABLET | Freq: Every day | ORAL | Status: DC
  Administered 2018-03-16 – 2018-03-17 (×2): 5 mg via ORAL
  Filled 2018-03-16 (×2): qty 1

## 2018-03-16 NOTE — Discharge Summary (Signed)
Physician Discharge Summary  Patient ID: Judd Mccubbin MRN: [id number] DOB/AGE: [birth year] 57 y.o.  Admit date: 03/10/2018 Discharge date: 03/17/2018  Admission Diagnoses: Unstable angina  Discharge Diagnoses:  Active Problems:   Unstable angina (HCC)   Chronic renal disease, stage 3, moderately decreased glomerular filtration rate (GFR) between 30-59 mL/min/1.73 square meter (HCC)   S/P CABG x 4  Patient Active Problem List   Diagnosis Date Noted  . 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 Unstable angina (HCC) 03/10/2018  . Chronic renal disease, stage 3, moderately decreased glomerular filtration rate (GFR) between 30-59 mL/min/1.73 square meter (HCC) 03/10/2018  . Essential hypertension 03/09/2018  . Diabetes mellitus due to underlying condition with unspecified complications (HCC) 03/09/2018  . Angina pectoris (HCC) 03/09/2018  . Hearing loss 02/23/2018  . MELAS syndrome (HCC) 02/23/2018  . Peripheral vascular disease (HCC) 02/23/2018   History of present illness: The patient is a 57 year old male who was referred to Dr. Tomie China for evaluation of chest tightness.  He has known cardiac risk factors including hypertension and diabetes mellitus.  Over the past several weeks he has had chest tightness upon exertion occurring consistently with exercise.  He does not exercise on a consistent basis but his work does involve significant physical activity.  The patient would develop chest tightness with radiation into the neck as well as into the arm.  He denied orthopnea, PND or syncope.  After evaluation by Dr. Tomie China it was determined the best plan would be to proceed with cardiac catheterization.  This was performed by Dr. Jeanmarie Hubert on 03/10/2018.  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 Tyrone Sage who evaluated the patient and his studies and agreed recommendations to proceed with CABG.    Discharged Condition:  good  Hospital Course: Patient was taken to the operating room on 03/12/2018 at which time he underwent the below described procedure.  Tolerated well was taken to the surgical intensive care unit in stable condition.  Postoperative hospital course:  The patient has progressed nicely.  He has remained hemodynamically stable.  He was weaned from the ventilator without difficulty using standard protocols.  He has had some sinus tachycardia and beta blocker has been adjusted.  All routine lines, monitors and drainage devices have been discontinued in the standard fashion.  He does have an expected acute blood loss anemia and values are stable.  He did have some postoperative renal insufficiency and values are improving.  His recent creatinine is 1.41 and ACE inhibitor has not been initiated.  He has not felt to be a candidate for statin due to his possible MELAS syndrome.  Blood sugars are improving and he is being transition to his oral medications.  He will require aggressive lifestyle management including nutrition as an outpatient.  Incisions are noted to be healing well without evidence of infection.  Oxygen has been weaned and he maintains good saturations on room air.  He is tolerating gradually increasing activity using cardiac rehab protocols.  At time of discharge the patient is felt to be quite stable.  Consults: cardiology  Significant Diagnostic Studies: angiography: Cardiac catheterization LEFT HEART CATH AND CORONARY ANGIOGRAPHY  Conclusion     Ost Ramus to Ramus lesion is 80% stenosed.  Ost Cx to Prox Cx lesion is 80% stenosed.  Mid Cx lesion is 50% stenosed.  Prox RCA lesion is 25% stenosed.  Ost LAD to Prox LAD lesion is 75% stenosed.  Ost 1st Diag  lesion is 80% stenosed.  Mid LAD lesion is 25% stenosed.  The left ventricular systolic function is normal.  LV end diastolic pressure is normal.  The left ventricular ejection fraction is 55-65% by visual  estimate.  There is no aortic valve stenosis.     Treatments: surgery:  03/12/2018  12:50 PM  PATIENT:  [name]  57 y.o. male  PRE-OPERATIVE DIAGNOSIS:  CAD  POST-OPERATIVE DIAGNOSIS:  CAD  PROCEDURE:  Procedure(s): CORONARY ARTERY BYPASS GRAFTING (CABG) x 4 WITH ENODOSCOPIC HARVESTING OF RIGHT SAPHENOUS VEIN.  LIMA TO LAD, SVG TO DIAGONAL, SEQUENTIAL SVG TO INTERMEDIUS RAMUS AND DISTAL CIRCUMFLEX (N/A) TRANSESOPHAGEAL ECHOCARDIOGRAM (TEE) (N/A) LIMA-LAD SVG- -DIAG2 SVG-RAMUS-DISTAL CX  SURGEON:  Surgeon(s) and Role:    [name], MD - Primary  PHYSICIAN ASSISTANT: Bera Pinela PA-C    Discharge Exam: Blood pressure 121/79, pulse 99, temperature 98.3 F (36.8 C), temperature source Oral, resp. rate (!) 23, height 5' 6.5" (1.689 m), weight 56.9 kg (125 lb 6.4 oz), SpO2 97 %.   General appearance: alert, cooperative and no distress Heart: regular rate and rhythm and tachy Lungs: clear to auscultation bilaterally Abdomen: benign Extremities: no edema Wound: incis healing well   Disposition: Discharge disposition: 01-Home or Self Care       Discharge Instructions    Discharge patient   Complete by:  As directed    Discharge disposition:  01-Home or Self Care   Discharge patient date:  03/17/2018     Allergies as of 03/17/2018   No Known Allergies     Medication List    STOP taking these medications   lisinopril 10 MG tablet Commonly known as:  PRINIVIL,ZESTRIL   nitroGLYCERIN 0.4 MG SL tablet Commonly known as:  NITROSTAT     TAKE these medications   aspirin EC 325 MG tablet Take 1 tablet (325 mg total) by mouth daily.   glipiZIDE 10 MG 24 hr tablet Commonly known as:  GLUCOTROL XL Take 10 mg by mouth 2 (two) times daily.   metoprolol tartrate 25 MG tablet Commonly known as:  LOPRESSOR Take 1 tablet (25 mg total) by mouth 2 (two) times daily.   oxyCODONE 5 MG immediate release tablet Commonly known as:  Oxy  IR/ROXICODONE Take 1-2 tablets (5-10 mg total) by mouth every 6 (six) hours as needed for up to 7 days for severe pain.   pioglitazone 30 MG tablet Commonly known as:  ACTOS Take 30 mg by mouth every morning.   sitaGLIPtin 100 MG tablet Commonly known as:  JANUVIA Take 100 mg by mouth every morning.      Follow-up Information    Revankar, Aundra Dubin, MD Follow up.   Specialty:  Cardiology Why:  Please see discharge paperwork for a 2-week cardiology follow-up appointment Contact information: [address] [place]  [place] [address] [phone]        [name], MD Follow up.   Specialty:  Cardiothoracic Surgery Why:  Appointment to see the physician assistant on April 12, 2018 at 2:30 PM.  Is obtain a chest x-ray at [facility] imaging at 2 PM.  [facility] imaging is located in the same office complex on the first floor. Contact information: [address] [employer] [address] [place] [address] [phone]          The patient has been discharged on:   1.Beta Blocker:  Yes Cove.Etienne   ]  No   [   ]                              If No, reason:  2.Ace Inhibitor/ARB: Yes [   ]                                     No  [ n   ]                                     If No, reason:renal insuff  3.Statin:   Yes [   ]                  No  [  n ]                  If No, reason:contraindicated  4.Marlowe KaysValentino Hue  Cove.Etienne   ]                  No   [   ]                  If No, reason:   Signed: Glenice Laine Seleste Tallman 03/17/2018, 7:24 AM

## 2018-03-16 NOTE — Progress Notes (Signed)
CARDIAC REHAB PHASE I   PRE:  Rate/Rhythm: 113 ST  BP:  Sitting: 101/69      SaO2: 99% on RA  MODE:  Ambulation: 760 ft   POST:  Rate/Rhythm: 129 ST  BP:  Sitting: 90/68    SaO2: 100% on RA  Pt ambulated, stand-by assist. Pt had loss of balance during turn. Pt c/o "weakness" at end of walk. BP lower, pt denies dizziness. Pt sitting in recliner, call bell within reach. [phone]  Reynold Bowen, RN BSN 03/16/2018 1:42 PM

## 2018-03-16 NOTE — Progress Notes (Addendum)
Pt has been in ST through out night with occasional PVC. HR went up to 130's to 150's when got up to go to restroom last night and this am. HR came down with rest and scheduled BB. No s/s of distress. Call light and urinal within reach. Will continue to monitor.

## 2018-03-16 NOTE — Care Management Note (Signed)
Case Management Note  Patient Details  Name: Daniel Malone MRN: [id number] Date of Birth: [birth year]  Subjective/Objective:                 Spoke to patient and MIL at bedside. He states he goes to Dr Egbert Garibaldi is [place]. He states he pays out of pocket for his meds and get patient assistance for Jardiance. He denies difficulties paying for meds. Reinforced applying for medicaid. Will continue to follow for medication needs at DC.    Action/Plan:  Continue to follow for MATCH needs at DC.  Expected Discharge Date:                  Expected Discharge Plan:  Home/Self Care  In-House Referral:  Financial Counselor  Discharge planning Services  CM Consult  Post Acute Care Choice:    Choice offered to:     DME Arranged:    DME Agency:     HH Arranged:    HH Agency:     Status of Service:  In process, will continue to follow  If discussed at Long Length of Stay Meetings, dates discussed:    Additional Comments:  Lawerance Sabal, RN 03/16/2018, 11:23 AM

## 2018-03-16 NOTE — Discharge Instructions (Signed)
Coronary Artery Bypass Grafting, Care After °These instructions give you information on caring for yourself after your procedure. Your doctor may also give you more specific instructions. Call your doctor if you have any problems or questions after your procedure. °Follow these instructions at home: °· Only take medicine as told by your doctor. Take medicines exactly as told. Do not stop taking medicines or start any new medicines without talking to your doctor first. °· Take your pulse as told by your doctor. °· Do deep breathing as told by your doctor. Use your breathing device (incentive spirometer), if given, to practice deep breathing several times a day. Support your chest with a pillow or your arms when you take deep breaths or cough. °· Keep the area clean, dry, and protected where the surgery cuts (incisions) were made. Remove bandages (dressings) only as told by your doctor. If strips were applied to surgical area, do not take them off. They fall off on their own. °· Check the surgery area daily for puffiness (swelling), redness, or leaking fluid. °· If surgery cuts were made in your legs: °? Avoid crossing your legs. °? Avoid sitting for long periods of time. Change positions every 30 minutes. °? Raise your legs when you are sitting. Place them on pillows. °· Wear stockings that help keep blood clots from forming in your legs (compression stockings). °· Only take sponge baths until your doctor says it is okay to take showers. Pat the surgery area dry. Do not rub the surgery area with a washcloth or towel. Do not bathe, swim, or use a hot tub until your doctor says it is okay. °· Eat foods that are high in fiber. These include raw fruits and vegetables, whole grains, beans, and nuts. Choose lean meats. Avoid canned, processed, and fried foods. °· Drink enough fluids to keep your pee (urine) clear or pale yellow. °· Weigh yourself every day. °· Rest and limit activity as told by your doctor. You may be told  to: °? Stop any activity if you have chest pain, shortness of breath, changes in heartbeat, or dizziness. Get help right away if this happens. °? Move around often for short amounts of time or take short walks as told by your doctor. Gradually become more active. You may need help to strengthen your muscles and build endurance. °? Avoid lifting, pushing, or pulling anything heavier than 10 pounds (4.5 kg) for at least 6 weeks after surgery. °· Do not drive until your doctor says it is okay. °· Ask your doctor when you can go back to work. °· Ask your doctor when you can begin sexual activity again. °· Follow up with your doctor as told. °Contact a doctor if: °· You have puffiness, redness, more pain, or fluid draining from the incision site. °· You have a fever. °· You have puffiness in your ankles or legs. °· You have pain in your legs. °· You gain 2 or more pounds (0.9 kg) a day. °· You feel sick to your stomach (nauseous) or throw up (vomit). °· You have watery poop (diarrhea). °Get help right away if: °· You have chest pain that goes to your jaw or arms. °· You have shortness of breath. °· You have a fast or irregular heartbeat. °· You notice a "clicking" in your breastbone when you move. °· You have numbness or weakness in your arms or legs. °· You feel dizzy or light-headed. °This information is not intended to replace advice given to you by   your health care provider. Make sure you discuss any questions you have with your health care provider. °Document Released: 10/11/2013 Document Revised: 03/13/2016 Document Reviewed: 03/15/2013 °Elsevier Interactive Patient Education © 2017 [employer] ° °

## 2018-03-16 NOTE — Progress Notes (Signed)
Epicardial pacing wires removed per MD order without difficulty.  Will continue to monitor. 

## 2018-03-16 NOTE — Progress Notes (Addendum)
[address]       [place] [address]             [phone]      4 Days Post-Op Procedure(s) (LRB): CORONARY ARTERY BYPASS GRAFTING (CABG) x 4 WITH ENODOSCOPIC HARVESTING OF RIGHT SAPHENOUS VEIN.  LIMA TO LAD, SVG TO DIAGONAL, SEQUENTIAL SVG TO INTERMEDIUS RAMUS AND DISTAL CIRCUMFLEX (N/A) TRANSESOPHAGEAL ECHOCARDIOGRAM (TEE) (N/A) Subjective: Feels fine, + BM  Objective: Vital signs in last 24 hours: Temp:  [98.2 F (36.8 C)-99.1 F (37.3 C)] 98.4 F (36.9 C) (05/28 0359) Pulse Rate:  [99-127] 100 (05/28 0359) Cardiac Rhythm: Sinus tachycardia (05/28 0430) Resp:  [17-22] 17 (05/28 0359) BP: (90-158)/(64-99) 143/93 (05/28 0359) SpO2:  [97 %-100 %] 100 % (05/28 0359) Weight:  [58.1 kg (128 lb 1.6 oz)] 58.1 kg (128 lb 1.6 oz) (05/28 0359)  Hemodynamic parameters for last 24 hours:    Intake/Output from previous day: 05/27 0701 - 05/28 0700 In: 200 [P.O.:200] Out: 1425 [Urine:1425] Intake/Output this shift: No intake/output data recorded.  General appearance: alert, cooperative and no distress Heart: regular rate and rhythm and occ extrasystole Lungs: clear to auscultation bilaterally Abdomen: benign Extremities: no edema Wound: incis healing well  Lab Results: Recent Labs    03/14/18 0406 03/15/18 0213  WBC 10.6* 9.3  HGB 9.4* 9.2*  HCT 28.6* 28.1*  PLT 91* 101*   BMET:  Recent Labs    03/14/18 0406 03/15/18 0213  NA 132* 136  K 5.4* 4.5  CL 104 104  CO2 22 24  GLUCOSE 174* 120*  BUN 24* 24*  CREATININE 1.46* 1.41*  CALCIUM 8.2* 8.7*    PT/INR: No results for input(s): LABPROT, INR in the last 72 hours. ABG    Component Value Date/Time   PHART 7.304 (L) 03/12/2018 2131   HCO3 21.0 03/12/2018 2131   TCO2 23 03/13/2018 1629   ACIDBASEDEF 5.0 (H) 03/12/2018 2131   O2SAT 99.0 03/12/2018 2131   CBG (last 3)  Recent Labs    03/15/18 1633 03/15/18 2136 03/16/18 0642  GLUCAP 172* 132* 123*    Meds Scheduled  Meds: . aspirin EC  325 mg Oral Daily  . docusate sodium  200 mg Oral Daily  . glipiZIDE  20 mg Oral Q breakfast  . insulin aspart  0-24 Units Subcutaneous TID AC & HS  . metoprolol tartrate  12.5 mg Oral BID  . pantoprazole  40 mg Oral QAC breakfast  . sodium chloride flush  3 mL Intravenous Q12H   Continuous Infusions: . sodium chloride     PRN Meds:.sodium chloride, acetaminophen, bisacodyl **OR** bisacodyl, lactulose, ondansetron **OR** ondansetron (ZOFRAN) IV, oxyCODONE, simethicone, sodium chloride flush, traMADol  Xrays Dg Chest 2 View  Result Date: 03/15/2018 CLINICAL DATA:  3 days post op cabg  SOB this morning EXAM: CHEST - 2 VIEW COMPARISON:  03/14/2018 FINDINGS: The right IJ introducer sheath has been removed. Stable 10% right apical pneumothorax. Rounded radiopacity compatible with a button projects over the right lung base. Prior CABG.  Epicardial pacer leads noted. Small bilateral pleural effusions cause blunting of the posterior costophrenic angles. IMPRESSION: 1. Stable 10% right apical pneumothorax. 2. Small bilateral pleural effusions. 3. Right-sided introducer sheath has been removed. Electronically Signed   By: Gaylyn Rong M.D.   On: 03/15/2018 08:11    Assessment/Plan: S/P Procedure(s) (LRB): CORONARY ARTERY BYPASS GRAFTING (CABG) x 4 WITH ENODOSCOPIC HARVESTING OF RIGHT SAPHENOUS VEIN.  LIMA TO LAD, SVG TO DIAGONAL,  SEQUENTIAL SVG TO INTERMEDIUS RAMUS AND DISTAL CIRCUMFLEX (N/A) TRANSESOPHAGEAL ECHOCARDIOGRAM (TEE) (N/A)  1 doing well overall 2 BP quite variable low<->high, tachy with PVC's will increase metoprolol dose 3 no new labs 4 BS adeq control, HGB A1C 7.1- on glipizide, tradjenta starting today  5 remove wires 6 poss home 1-2 days  LOS: 6 days    Rowe Clack 03/16/2018 Recheck CBC and chest x-ray tomorrow a.m. patient examined and medical record reviewed,agree with above note. Kathlee Nations Trigt III 03/16/2018

## 2018-03-17 ENCOUNTER — Inpatient Hospital Stay (HOSPITAL_COMMUNITY): Payer: Self-pay

## 2018-03-17 LAB — CBC
HCT: 34.7 % — ABNORMAL LOW (ref 39.0–52.0)
Hemoglobin: 11.6 g/dL — ABNORMAL LOW (ref 13.0–17.0)
MCH: 31.2 pg (ref 26.0–34.0)
MCHC: 33.4 g/dL (ref 30.0–36.0)
MCV: 93.3 fL (ref 78.0–100.0)
Platelets: 174 10*3/uL (ref 150–400)
RBC: 3.72 MIL/uL — ABNORMAL LOW (ref 4.22–5.81)
RDW: 12.8 % (ref 11.5–15.5)
WBC: 8.5 10*3/uL (ref 4.0–10.5)

## 2018-03-17 LAB — GLUCOSE, CAPILLARY: Glucose-Capillary: 124 mg/dL — ABNORMAL HIGH (ref 65–99)

## 2018-03-17 MED ORDER — OXYCODONE HCL 5 MG PO TABS: 5.0000 mg | ORAL_TABLET | Freq: Four times a day (QID) | ORAL | 0 refills | Status: AC | PRN

## 2018-03-17 MED ORDER — METOPROLOL TARTRATE 25 MG PO TABS: 25.0000 mg | ORAL_TABLET | Freq: Two times a day (BID) | ORAL | 1 refills | Status: DC

## 2018-03-17 MED FILL — Sodium Bicarbonate IV Soln 8.4%: INTRAVENOUS | Qty: 150 | Status: AC

## 2018-03-17 MED FILL — Lidocaine HCl Local Soln Prefilled Syringe 100 MG/5ML (2%): INTRAMUSCULAR | Qty: 5 | Status: AC

## 2018-03-17 MED FILL — Electrolyte-R (PH 7.4) Solution: INTRAVENOUS | Qty: 4000 | Status: AC

## 2018-03-17 MED FILL — Mannitol IV Soln 20%: INTRAVENOUS | Qty: 500 | Status: AC

## 2018-03-17 MED FILL — Heparin Sodium (Porcine) Inj 1000 Unit/ML: INTRAMUSCULAR | Qty: 10 | Status: AC

## 2018-03-17 MED FILL — Magnesium Sulfate Inj 50%: INTRAMUSCULAR | Qty: 10 | Status: AC

## 2018-03-17 MED FILL — Potassium Chloride Inj 2 mEq/ML: INTRAVENOUS | Qty: 40 | Status: AC

## 2018-03-17 MED FILL — Sodium Chloride IV Soln 0.9%: INTRAVENOUS | Qty: 3000 | Status: AC

## 2018-03-17 MED FILL — Heparin Sodium (Porcine) Inj 1000 Unit/ML: INTRAMUSCULAR | Qty: 30 | Status: AC

## 2018-03-17 NOTE — Progress Notes (Addendum)
Ed completed with good reception. Motivated to quit tobacco. Will refer to [facility] but cannot put in order as pts cardiologist is Revankar (we do not have permission).   [id number] Ethelda Chick CES, ACSM 8:29 AM 03/17/2018

## 2018-03-17 NOTE — Op Note (Signed)
NAMEJORON, VELIS MEDICAL RECORD RU:[id number] ACCOUNT [id number] DATE OF BIRTH:1961-01-23 FACILITY: [facility] LOCATION: [facility] PHYSICIAN:Jacinta Penalver Bari Thi Klich, MD  OPERATIVE REPORT  DATE OF PROCEDURE:  03/12/2018  PREOPERATIVE DIAGNOSES:  Severe 3-vessel coronary artery disease with unstable angina.  POSTOPERATIVE DIAGNOSES:  Severe 3-vessel coronary artery disease with unstable angina.  SURGICAL PROCEDURE:  Coronary artery bypass grafting x4 with the left internal mammary to the left anterior descending coronary artery, reverse saphenous vein graft to the diagonal coronary artery, sequential reverse saphenous vein graft to the  intermediate and distal circumflex coronary artery with right thigh and calf endovein greater saphenous harvesting endoscopically.  SURGEON:  Sheliah Plane, MD  FIRST ASSISTANT:  Gershon Crane, [place]  BRIEF HISTORY:  The patient is a 57 year old male who has no previous cardiac history who presented with new onset of rapidly progressing unstable anginal symptoms.  He underwent cardiac catheterization by Dr. Eldridge Dace which demonstrated complex proximal  80% LAD obstruction involving the diagonal, also a 78% obstruction of the intermediate and long obstruction of 70% to 80% of the circumflex.  The right coronary artery had luminal irregularities, but no high-grade stenosis.  Overall, ventricular  function was preserved.  Because of the patient's significant 3-vessel coronary artery disease, functioning equivalent to the left main disease, the patient was referred for consideration of coronary artery bypass grafting.  Risks and options were  discussed with the patient in detail.  He also had a family history of MELAS syndrome.  The patient was agreeable with proceeding with coronary artery bypass grafting and signed informed consent.  DESCRIPTION OF PROCEDURE:  With Swan-Ganz and arterial line monitors in place, the patient underwent general endotracheal anesthesia without  incident.  A TEE probe was placed by Dr. ____ confirming preserved LV function without significant valvular  abnormalities. The skin on the chest and legs was prepped with Betadine and draped in the usual sterile manner.  Appropriate timeout was performed, and we proceeded with right vein endoscopic harvesting.  This was performed by Gershon Crane, PA-C.  A median sternotomy was  performed, and the left internal mammary artery was dissected down as a pedicle graft.  The distal artery was divided.  It had good free flow.  Pericardium was opened.  Overall, ventricular function appeared preserved.  The patient was systemically  heparinized.  The ascending aorta was cannulated.  The right atrium was cannulated.  An aortic root vent cardioplegia needle was introduced into the ascending aorta.  The saphenous vein harvested from the right greater saphenous thigh and calf was of  excellent quality and caliber.  The patient was placed on cardiopulmonary bypass, 2.4 L/min per sq m.  The sites of anastomoses were selected and dissected out of the epicardium.  The patient's body temperature was cooled to 32 degrees.  Aortic  crossclamp was applied, and 500 mL of cold blood potassium cardioplegia was administered with diastolic arrest of the heart.  Myocardial septal temperature was mild throughout the crossclamp.  Attention was turned to the intermediate and circumflex.  The  intermediate vessel was opened.  It was a very thin-walled vessel.  It admitted a 1 mm probe distally.  The vessel was 1.2 to 1.3 mm in size.  Using a diamond-type side-to-side anastomosis, it was carried out with a running 8-0 Prolene.  The distal  extent of the same vein was then carried to the distal circumflex branch.  This vessel was also a relatively small branch.  It admitted a 1.5 mm probe.  The vessel was  very thin-walled and again the distal anastomosis was performed with a running 8-0  Prolene.  Additional cold blood cardioplegia was  administered down the vein graft.  We then turned our attention to the diagonal vessel, which was opened and admitted a 1 mm probe distally.  Using a segment of reverse saphenous vein graft, a running 8-0  Prolene was used for distal anastomosis.  The mid left anterior descending coronary artery was then opened.  It admitted a 1.5 mm probe.  Proximally and distally using a running 8-0 Prolene, the left internal mammary artery was anastomosed to the left  anterior descending coronary artery.  The fascia was tacked to the epicardium. With release of the bulldog on the mammary artery, there was rise in myocardial septal temperature.  The bulldog was placed back on the mammary artery, and with the crossclamp  still in place, 2 punch aortotomies were performed, and each of the 2 vein grafts were anastomosed to the ascending aorta.  The bulldog on the mammary artery was removed with prompt rise in myocardial septal temperature.  The aortic crossclamp was  removed with total cross clamp time 95 minutes.  The patient required electrical defibrillation and returned to a sinus rhythm.  Sites of anastomoses were inspected and free of bleeding.  The patient was then rewarmed to 37 degrees.  He was then  ventilated and weaned from cardiopulmonary bypass without difficulty.  He remained hemodynamically stable.  He was decannulated in the usual fashion.  Protamine sulfate was administered with operative field hemostatic.  Atrial and ventricular pacing  wires were applied.  Graft markers were applied to the left pleural tube and a Blake mediastinal drain was left in place.  The pericardium was loosely reapproximated.  The sternum was closed with #6 stainless steel wire.  The fascia was closed with  interrupted 0 Vicryl and 3-0 Vicryl subcutaneous tissue, 4-0 subcuticular stitch in skin edges.  Dry dressings were applied.  Sponge and needle count was reported as correct at completion of the procedure.  Total pump time was 117  minutes.  Because of  moderate renal insufficiency, the patient was maintained on low-dose dopamine while on cardiopulmonary bypass with excellent urine output.  LN/NUANCE  D:03/17/2018 T:03/17/2018 JOB:[id number]

## 2018-03-17 NOTE — Progress Notes (Signed)
03/17/2018 12:33 PM Discharge AVS meds taken today and those due this evening reviewed.  Follow-up appointments and when to call md reviewed.  D/C IV and TELE.  Questions and concerns addressed.   D/C home per orders. Kathryne Hitch

## 2018-03-17 NOTE — Progress Notes (Signed)
      [address]       [place] [address]             [phone]      5 Days Post-Op Procedure(s) (LRB): CORONARY ARTERY BYPASS GRAFTING (CABG) x 4 WITH ENODOSCOPIC HARVESTING OF RIGHT SAPHENOUS VEIN.  LIMA TO LAD, SVG TO DIAGONAL, SEQUENTIAL SVG TO INTERMEDIUS RAMUS AND DISTAL CIRCUMFLEX (N/A) TRANSESOPHAGEAL ECHOCARDIOGRAM (TEE) (N/A) Subjective: Feels fine, no specific c/o  Objective: Vital signs in last 24 hours: Temp:  [98.1 F (36.7 C)-98.7 F (37.1 C)] 98.3 F (36.8 C) (05/29 0439) Pulse Rate:  [87-105] 99 (05/29 0439) Cardiac Rhythm: Sinus tachycardia (05/28 2236) Resp:  [16-27] 23 (05/29 0439) BP: (116-133)/(67-88) 121/79 (05/29 0439) SpO2:  [97 %-99 %] 97 % (05/29 0439) Weight:  [56.9 kg (125 lb 6.4 oz)] 56.9 kg (125 lb 6.4 oz) (05/29 0439)  Hemodynamic parameters for last 24 hours:    Intake/Output from previous day: 05/28 0701 - 05/29 0700 In: 3 [I.V.:3] Out: -  Intake/Output this shift: No intake/output data recorded.  General appearance: alert, cooperative and no distress Heart: regular rate and rhythm and tachy Lungs: clear to auscultation bilaterally Abdomen: benign Extremities: no edema Wound: incis healing well  Lab Results: Recent Labs    03/15/18 0213 03/17/18 0434  WBC 9.3 8.5  HGB 9.2* 11.6*  HCT 28.1* 34.7*  PLT 101* 174   BMET:  Recent Labs    03/15/18 0213  NA 136  K 4.5  CL 104  CO2 24  GLUCOSE 120*  BUN 24*  CREATININE 1.41*  CALCIUM 8.7*    PT/INR: No results for input(s): LABPROT, INR in the last 72 hours. ABG    Component Value Date/Time   PHART 7.304 (L) 03/12/2018 2131   HCO3 21.0 03/12/2018 2131   TCO2 23 03/13/2018 1629   ACIDBASEDEF 5.0 (H) 03/12/2018 2131   O2SAT 99.0 03/12/2018 2131   CBG (last 3)  Recent Labs    03/16/18 1628 03/16/18 2216 03/17/18 0648  GLUCAP 121* 152* 124*    Meds Scheduled Meds: . aspirin EC  325 mg Oral Daily  . docusate sodium  200 mg Oral Daily  .  glipiZIDE  20 mg Oral Q breakfast  . insulin aspart  0-24 Units Subcutaneous TID AC & HS  . linagliptin  5 mg Oral Daily  . metoprolol tartrate  25 mg Oral BID  . pantoprazole  40 mg Oral QAC breakfast  . sodium chloride flush  3 mL Intravenous Q12H   Continuous Infusions: . sodium chloride     PRN Meds:.sodium chloride, acetaminophen, bisacodyl **OR** bisacodyl, lactulose, ondansetron **OR** ondansetron (ZOFRAN) IV, oxyCODONE, simethicone, sodium chloride flush, traMADol  Xrays No results found.  Assessment/Plan: S/P Procedure(s) (LRB): CORONARY ARTERY BYPASS GRAFTING (CABG) x 4 WITH ENODOSCOPIC HARVESTING OF RIGHT SAPHENOUS VEIN.  LIMA TO LAD, SVG TO DIAGONAL, SEQUENTIAL SVG TO INTERMEDIUS RAMUS AND DISTAL CIRCUMFLEX (N/A) TRANSESOPHAGEAL ECHOCARDIOGRAM (TEE) (N/A) Plan for discharge: see discharge orders CXR appears clear H/H improved Tol activities and ambulation conts to improve   LOS: 7 days    [place] 03/17/2018

## 2018-03-29 ENCOUNTER — Ambulatory Visit (INDEPENDENT_AMBULATORY_CARE_PROVIDER_SITE_OTHER): Payer: Self-pay | Admitting: Cardiology

## 2018-03-29 ENCOUNTER — Encounter: Payer: Self-pay | Admitting: Cardiology

## 2018-03-29 VITALS — BP 122/78 | HR 69 | Ht 66.5 in | Wt 131.8 lb

## 2018-03-29 DIAGNOSIS — I739 Peripheral vascular disease, unspecified: Secondary | ICD-10-CM

## 2018-03-29 DIAGNOSIS — I10 Essential (primary) hypertension: Secondary | ICD-10-CM

## 2018-03-29 DIAGNOSIS — E088 Diabetes mellitus due to underlying condition with unspecified complications: Secondary | ICD-10-CM

## 2018-03-29 DIAGNOSIS — Z951 Presence of aortocoronary bypass graft: Secondary | ICD-10-CM

## 2018-03-29 DIAGNOSIS — N183 Chronic kidney disease, stage 3 unspecified: Secondary | ICD-10-CM

## 2018-03-29 DIAGNOSIS — I2510 Atherosclerotic heart disease of native coronary artery without angina pectoris: Secondary | ICD-10-CM | POA: Insufficient documentation

## 2018-03-29 NOTE — Patient Instructions (Signed)
Medication Instructions:  Your physician recommends that you continue on your current medications as directed. Please refer to the Current Medication list given to you today.   Labwork: Your physician recommends that you have the following labs drawn today:  CBC and BMP  You should return to [place] office in 1 month to have Lipid and Liver checked.  You should be fasting (nothing to eat or drink) prior to these labs.   Testing/Procedures: NONE   Follow-Up: Your physician wants you to follow-up in: 2 months in [place].   You will receive a reminder letter in the mail two months in advance. If you don't receive a letter, please call our office to schedule the follow-up appointment.    Any Other Special Instructions Will Be Listed Below (If Applicable).     If you need a refill on your cardiac medications before your next appointment, please call your pharmacy.

## 2018-03-29 NOTE — Progress Notes (Signed)
Cardiology Office Note:    Date:  03/29/2018   ID:  Daniel Malone, DOB [birth year], MRN [id number]  PCP:  Daniel DoneSlatosky, [name], MD  Cardiologist:  Garwin Brothersajan R Leroi Haque, MD   Referring MD: Daniel DoneSlatosky, [name], MD    ASSESSMENT:    1. Coronary artery disease involving native coronary artery of native heart without angina pectoris   2. Essential hypertension   3. Diabetes mellitus due to underlying condition with complication, without long-term current use of insulin (HCC)   4. Chronic renal disease, stage 3, moderately decreased glomerular filtration rate (GFR) between 30-59 mL/min/1.73 square meter (HCC)   5. S/P CABG x 4   6. Peripheral vascular disease (HCC)    PLAN:    In order of problems listed above:  1. Secondary prevention stressed with the patient.  Importance of compliance with diet and medications stressed and he vocalized understanding. 2. His blood pressure is stable. 3. Diet was discussed for dyslipidemia and diabetes mellitus.  He will have a Chem-7 and CBC done today.  He will be back in 1 month for a liver lipid check at [place] in [place].  He will be seen in follow-up appointment in 2 months or earlier if he has any concerns.  I congratulated him about his excellent progress and exercise protocol and encouraged him to continue it.   Medication Adjustments/Labs and Tests Ordered: Current medicines are reviewed at length with the patient today.  Concerns regarding medicines are outlined above.  Orders Placed This Encounter  Procedures  . CBC  . Basic metabolic panel  . Lipid Profile  . Hepatic function panel   No orders of the defined types were placed in this encounter.    No chief complaint on file.    History of Present Illness:    [name] is a 57 y.o. male.  Patient was evaluated by me for significant symptoms of chest pain and recommended coronary angiography.  He had multivessel disease and underwent CABG surgery.  Subsequently has done fine and  tells me that he walked 1 mile yesterday.  This is wonderful progress especially in view of the fact that he is a diabetic and has history of peripheral vascular disease and is 3 weeks out after surgery.  He denies any chest pain orthopnea or PND.  He is happy about the progress and his mother-in-law accompanies him for this visit.  At the time of my evaluation, the patient is alert awake oriented and in no distress.  Past Medical History:  Diagnosis Date  . Chronic renal disease, stage 3, moderately decreased glomerular filtration rate (GFR) between 30-59 mL/min/1.73 square meter (HCC) 03/10/2018  . Diabetes mellitus, type 2 (HCC) 02/23/2018  . Hearing loss 02/23/2018  . Hypertension 02/23/2018  . MELAS syndrome (HCC) 02/23/2018  . Peripheral vascular disease (HCC) 02/23/2018    Past Surgical History:  Procedure Laterality Date  . CORONARY ARTERY BYPASS GRAFT N/A 03/12/2018   Procedure: CORONARY ARTERY BYPASS GRAFTING (CABG) x 4 WITH ENODOSCOPIC HARVESTING OF RIGHT SAPHENOUS VEIN.  LIMA TO LAD, SVG TO DIAGONAL, SEQUENTIAL SVG TO INTERMEDIUS RAMUS AND DISTAL CIRCUMFLEX;  Surgeon: Delight OvensGerhardt, Edward B, MD;  Location: [facility] OR;  Service: Open Heart Surgery;  Laterality: N/A;  . LEFT HEART CATH AND CORONARY ANGIOGRAPHY N/A 03/10/2018   Procedure: LEFT HEART CATH AND CORONARY ANGIOGRAPHY;  Surgeon: Corky CraftsVaranasi, Jayadeep S, MD;  [facility];  Service: Cardiovascular;  Laterality: N/A;  . NASAL SINUS SURGERY    . TEE  WITHOUT CARDIOVERSION N/A 03/12/2018   Procedure: TRANSESOPHAGEAL ECHOCARDIOGRAM (TEE);  Surgeon: [name], MD;  Location: [facility] OR;  Service: Open Heart Surgery;  Laterality: N/A;    Current Medications: Current Meds  Medication Sig  . aspirin EC 325 MG tablet Take 1 tablet (325 mg total) by mouth daily.  Marland Kitchen glipiZIDE (GLUCOTROL XL) 10 MG 24 hr tablet Take 10 mg by mouth 2 (two) times daily.  . metoprolol tartrate (LOPRESSOR) 25 MG tablet Take 1 tablet (25 mg total) by mouth 2  (two) times daily.  . pioglitazone (ACTOS) 30 MG tablet Take 30 mg by mouth every morning.   . sitaGLIPtin (JANUVIA) 100 MG tablet Take 100 mg by mouth every morning.      Allergies:   Patient has no known allergies.   Social History   Socioeconomic History  . Marital status: Married    Spouse name: Not on file  . Number of children: 1  . Years of education: Not on file  . Highest education level: Not on file  Occupational History  . Not on file  Social Needs  . Financial resource strain: Not on file  . Food insecurity:    Worry: Not on file    Inability: Not on file  . Transportation needs:    Medical: Not on file    Non-medical: Not on file  Tobacco Use  . Smoking status: Never Smoker  . Smokeless tobacco: Current User    Types: Chew  Substance and Sexual Activity  . Alcohol use: Not Currently  . Drug use: Not Currently  . Sexual activity: Not on file  Lifestyle  . Physical activity:    Days per week: Not on file    Minutes per session: Not on file  . Stress: Not on file  Relationships  . Social connections:    Talks on phone: Not on file    Gets together: Not on file    Attends religious service: Not on file    Active member of club or organization: Not on file    Attends meetings of clubs or organizations: Not on file    Relationship status: Not on file  Other Topics Concern  . Not on file  Social History Narrative  . Not on file     Family History: The patient's family history includes CAD in his father; Diabetes in his mother.  ROS:   Please see the history of present illness.    All other systems reviewed and are negative.  EKGs/Labs/Other Studies Reviewed:    The following studies were reviewed today: Reviewed hospital records.   Recent Labs: 03/11/2018: ALT 16 03/13/2018: Magnesium 2.2 03/15/2018: BUN 24; Creatinine, Ser 1.41; Potassium 4.5; Sodium 136 03/17/2018: Hemoglobin 11.6; Platelets 174  Recent Lipid Panel No results found for: CHOL,  TRIG, HDL, CHOLHDL, VLDL, LDLCALC, LDLDIRECT  Physical Exam:    VS:  BP 122/78 (BP Location: Right Arm, Patient Position: Sitting, Cuff Size: Normal)   Pulse 69   Ht 5' 6.5" (1.689 m)   Wt 131 lb 12.8 oz (59.8 kg)   SpO2 99%   BMI 20.95 kg/m     Wt Readings from Last 3 Encounters:  03/29/18 131 lb 12.8 oz (59.8 kg)  03/17/18 125 lb 6.4 oz (56.9 kg)  03/09/18 133 lb 12.8 oz (60.7 kg)     GEN: Patient is in no acute distress HEENT: Normal NECK: No JVD; No carotid bruits LYMPHATICS: No lymphadenopathy CARDIAC: Hear sounds regular, 2/6 systolic murmur at  the apex. RESPIRATORY:  Clear to auscultation without rales, wheezing or rhonchi  ABDOMEN: Soft, non-tender, non-distended MUSCULOSKELETAL:  No edema; No deformity.  The patient's chest surgical wounds are healing well. SKIN: Warm and dry NEUROLOGIC:  Alert and oriented x 3 PSYCHIATRIC:  Normal affect   Signed, Garwin Brothers, MD  03/29/2018 5:11 PM    [facility]

## 2018-04-07 LAB — BASIC METABOLIC PANEL
BUN / CREAT RATIO: 14 (ref 9–20)
BUN: 20 mg/dL (ref 6–24)
CHLORIDE: 105 mmol/L (ref 96–106)
CO2: 20 mmol/L (ref 20–29)
CREATININE: 1.39 mg/dL — AB (ref 0.76–1.27)
Calcium: 9.3 mg/dL (ref 8.7–10.2)
GFR, EST AFRICAN AMERICAN: 65 mL/min/{1.73_m2} (ref >59)
GFR, EST NON AFRICAN AMERICAN: 56 mL/min/{1.73_m2} — AB (ref >59)
Glucose: 122 mg/dL — ABNORMAL HIGH (ref 65–99)
Potassium: 5.3 mmol/L — ABNORMAL HIGH (ref 3.5–5.2)
Sodium: 138 mmol/L (ref 134–144)

## 2018-04-07 LAB — CBC
HEMATOCRIT: 30.6 % — AB (ref 37.5–51.0)
Hemoglobin: 9.9 g/dL — ABNORMAL LOW (ref 13.0–17.7)
MCH: 30.8 pg (ref 26.6–33.0)
MCHC: 32.4 g/dL (ref 31.5–35.7)
MCV: 95 fL (ref 79–97)
Platelets: 289 10*3/uL (ref 150–450)
RBC: 3.21 x10E6/uL — AB (ref 4.14–5.80)
RDW: 14 % (ref 12.3–15.4)
WBC: 6.9 10*3/uL (ref 3.4–10.8)

## 2018-04-08 ENCOUNTER — Telehealth: Payer: Self-pay

## 2018-04-08 ENCOUNTER — Other Ambulatory Visit: Payer: Self-pay

## 2018-04-08 DIAGNOSIS — I2510 Atherosclerotic heart disease of native coronary artery without angina pectoris: Secondary | ICD-10-CM

## 2018-04-08 NOTE — Telephone Encounter (Signed)
Left voicemail to call the office regarding labs.

## 2018-04-09 ENCOUNTER — Ambulatory Visit: Payer: Self-pay | Admitting: Cardiology

## 2018-04-09 ENCOUNTER — Other Ambulatory Visit: Payer: Self-pay | Admitting: Cardiothoracic Surgery

## 2018-04-09 DIAGNOSIS — I25119 Atherosclerotic heart disease of native coronary artery with unspecified angina pectoris: Secondary | ICD-10-CM

## 2018-04-12 ENCOUNTER — Ambulatory Visit (INDEPENDENT_AMBULATORY_CARE_PROVIDER_SITE_OTHER): Payer: Self-pay | Admitting: Physician Assistant

## 2018-04-12 ENCOUNTER — Ambulatory Visit
Admission: RE | Admit: 2018-04-12 | Discharge: 2018-04-12 | Disposition: A | Discharge status: Home / Self Care | Payer: Self-pay | Source: Ambulatory Visit | Attending: Cardiothoracic Surgery | Admitting: Cardiothoracic Surgery

## 2018-04-12 ENCOUNTER — Other Ambulatory Visit: Payer: Self-pay

## 2018-04-12 VITALS — BP 103/64 | HR 69 | Resp 16 | Ht 66.5 in | Wt 129.6 lb

## 2018-04-12 DIAGNOSIS — Z951 Presence of aortocoronary bypass graft: Secondary | ICD-10-CM

## 2018-04-12 DIAGNOSIS — I25119 Atherosclerotic heart disease of native coronary artery with unspecified angina pectoris: Secondary | ICD-10-CM

## 2018-04-12 NOTE — Patient Instructions (Signed)
Make every effort to keep your diabetes under very tight control.  Follow up closely with your primary care physician or endocrinologist and strive to keep their hemoglobin A1c levels as low as possible, preferably near or below 6.0.  The long term benefits of strict control of diabetes are far reaching and critically important for your overall health and survival.  You may return to driving an automobile as long as you are no longer requiring oral narcotic pain relievers during the daytime.  It would be wise to start driving only short distances during the daylight and gradually increase from there as you feel comfortable.  You may continue to gradually increase your physical activity as tolerated.  Refrain from any heavy lifting or strenuous use of your arms and shoulders until at least 8 weeks from the time of your surgery, and avoid activities that cause increased pain in your chest on the side of your surgical incision.  Otherwise you may continue to increase activities without any particular limitations.  Increase the intensity and duration of physical activity gradually.  You are encouraged to enroll and participate in the outpatient cardiac rehab program beginning as soon as practical. 

## 2018-04-12 NOTE — Progress Notes (Signed)
  HPI: Coronary artery bypass grafting x4 with the left internal mammary to the left anterior descending coronary artery, reverse saphenous vein graft to the diagonal coronary artery, sequential reverse saphenous vein graft to the  intermediate and distal circumflex coronary artery with right thigh and calf endovein greater saphenous harvesting endoscopically by Dr. Tyrone SageGerhardt on 03/12/2018.  Patient returns for routine postoperative follow-up having undergone the aforementioned surgery. Since hospital discharge the patient reports his breathing is much improved since surgery. He denies shortness of breath, chest pain, tachy palpitations, or fever.   Current Outpatient Medications  Medication Sig Dispense Refill  . aspirin EC 325 MG tablet Take 1 tablet (325 mg total) by mouth daily. 90 tablet 3  . glipiZIDE (GLUCOTROL XL) 10 MG 24 hr tablet Take 10 mg by mouth 2 (two) times daily.    . metoprolol tartrate (LOPRESSOR) 25 MG tablet Take 1 tablet (25 mg total) by mouth 2 (two) times daily. 60 tablet 1  . pioglitazone (ACTOS) 30 MG tablet Take 30 mg by mouth every morning.     . sitaGLIPtin (JANUVIA) 100 MG tablet Take 100 mg by mouth every morning.     Vital Signs: BP 103/64, HR 69,  RR 16, Oxygenation 99% on room air   Physical Exam: CV-RRR Pulmonary-Clear to auscultation bilaterally Abdomen-Soft, non tender, bowel sounds present Extremities-No LE edema Wounds-Clean and dry   Diagnostic Tests: CLINICAL DATA:  Status post CABG on Mar 12, 2018. No current chest complaints.  EXAM: CHEST - 2 VIEW  COMPARISON:  PA and lateral chest x-ray of Mar 17, 2018  FINDINGS: The lungs are well-expanded and clear. The heart and pulmonary vascularity are normal. The mediastinum is normal in width. The sternal wires are intact. There is no pleural effusion or pneumothorax. The bony thorax is unremarkable.  IMPRESSION: There is no residual pleural effusion nor other cardiopulmonary  abnormality.   Electronically Signed   By: David  Swazil[place]Jordan M.D.   On: 04/12/2018 14:07   Impression and Plan: Overall, Daniel Malone is recovering well from coronary artery bypass grafting surgery. He has already seen Dr. Tomie Chinaevankar in follow up. Patient was not discharged on a statin and unsure if because of MELAS syndrome. I will discuss this with Dr. Tyrone SageGerhardt. Patient has not been taking narcotics for pain. I instructed he may drive 30 minutes or less during the day and increase frequency and duration as tolerates after this weekend. He is already walking one and a half miles daily, states he does not have insurance, and does not wish to participate in cardiac rehab. He was instructed to continue with sternal precautions (I.e. No lifting more than 10 pounds for the next 4 weeks). He states his "sugars" have been well controlled. I asked him to make a follow up with his medical doctor in the next few months to follow up on HGA1C 7.1. He will return to see Dr. Tyrone SageGerhardt in 4-6 weeks and we will determine at that time if he may return to work.   Daniel Ballsonielle M Sylvestre Rathgeber, PA-C [facility] [phone]

## 2018-04-27 ENCOUNTER — Telehealth: Payer: Self-pay | Admitting: Cardiology

## 2018-04-27 NOTE — Telephone Encounter (Signed)
New Message:       STAT if patient feels like he/she is going to faint   1) Are you dizzy now? No  2) Do you feel faint or have you passed out? no  3) Do you have any other symptoms? Pt states he is fine when sitting down but when he stands up he gets dizzy on the spot  4) Have you checked your HR and BP (record if available)? No

## 2018-04-27 NOTE — Telephone Encounter (Signed)
Patient states that up until last week, he was feeling great and could walk a mile and a half at one time. Last week, patient went to the beach with his wife and while there he started feeling increasingly weak, short of breath on exertion, and has had a cough develop. Patient reports he was so weak yesterday that he felt faint and dizzy all day and did actually fall at one point. Patient went on to say that he did not realize that after his most recent hospitalization he was supposed to stop taking lisinopril 10 mg daily when he was started on metoprolol tartrate 25 mg twice daily to control his heart rate. Patient states he has been taking both medications. Advised him that could be causing the symptoms he has experienced. Patient denies having a blood pressure monitor at home. Patient is scheduled for an office visit on 04/29/18 at 11:20. Advised patient to only take metoprolol tartrate 25 mg twice daily from now on and to keep his scheduled follow up appointment. Patient verbalized understanding. No further questions.

## 2018-04-29 ENCOUNTER — Encounter: Payer: Self-pay | Admitting: *Deleted

## 2018-04-29 ENCOUNTER — Other Ambulatory Visit: Payer: Self-pay

## 2018-04-29 ENCOUNTER — Ambulatory Visit (HOSPITAL_BASED_OUTPATIENT_CLINIC_OR_DEPARTMENT_OTHER): Payer: Self-pay

## 2018-04-29 ENCOUNTER — Encounter: Payer: Self-pay | Admitting: Cardiology

## 2018-04-29 ENCOUNTER — Inpatient Hospital Stay (HOSPITAL_BASED_OUTPATIENT_CLINIC_OR_DEPARTMENT_OTHER)
Admission: EM | Admit: 2018-04-29 | Discharge: 2018-05-03 | DRG: 271 | Disposition: A | Discharge status: Home / Self Care | Payer: Self-pay | Attending: Cardiovascular Disease | Admitting: Cardiovascular Disease

## 2018-04-29 ENCOUNTER — Emergency Department (HOSPITAL_BASED_OUTPATIENT_CLINIC_OR_DEPARTMENT_OTHER): Payer: Self-pay

## 2018-04-29 ENCOUNTER — Encounter (HOSPITAL_BASED_OUTPATIENT_CLINIC_OR_DEPARTMENT_OTHER): Payer: Self-pay | Admitting: Emergency Medicine

## 2018-04-29 ENCOUNTER — Ambulatory Visit (INDEPENDENT_AMBULATORY_CARE_PROVIDER_SITE_OTHER): Payer: Self-pay | Admitting: Cardiology

## 2018-04-29 VITALS — BP 128/82 | HR 100 | Ht 66.5 in | Wt 140.0 lb

## 2018-04-29 DIAGNOSIS — I314 Cardiac tamponade: Secondary | ICD-10-CM | POA: Diagnosis present

## 2018-04-29 DIAGNOSIS — N183 Chronic kidney disease, stage 3 unspecified: Secondary | ICD-10-CM

## 2018-04-29 DIAGNOSIS — Z8249 Family history of ischemic heart disease and other diseases of the circulatory system: Secondary | ICD-10-CM

## 2018-04-29 DIAGNOSIS — E1151 Type 2 diabetes mellitus with diabetic peripheral angiopathy without gangrene: Secondary | ICD-10-CM | POA: Diagnosis present

## 2018-04-29 DIAGNOSIS — Z951 Presence of aortocoronary bypass graft: Secondary | ICD-10-CM

## 2018-04-29 DIAGNOSIS — E785 Hyperlipidemia, unspecified: Secondary | ICD-10-CM | POA: Diagnosis present

## 2018-04-29 DIAGNOSIS — Z79899 Other long term (current) drug therapy: Secondary | ICD-10-CM

## 2018-04-29 DIAGNOSIS — R0609 Other forms of dyspnea: Secondary | ICD-10-CM

## 2018-04-29 DIAGNOSIS — Z87891 Personal history of nicotine dependence: Secondary | ICD-10-CM

## 2018-04-29 DIAGNOSIS — I10 Essential (primary) hypertension: Secondary | ICD-10-CM

## 2018-04-29 DIAGNOSIS — Z833 Family history of diabetes mellitus: Secondary | ICD-10-CM

## 2018-04-29 DIAGNOSIS — E1122 Type 2 diabetes mellitus with diabetic chronic kidney disease: Secondary | ICD-10-CM | POA: Diagnosis present

## 2018-04-29 DIAGNOSIS — E8841 MELAS syndrome: Secondary | ICD-10-CM | POA: Diagnosis present

## 2018-04-29 DIAGNOSIS — Z09 Encounter for follow-up examination after completed treatment for conditions other than malignant neoplasm: Secondary | ICD-10-CM

## 2018-04-29 DIAGNOSIS — E088 Diabetes mellitus due to underlying condition with unspecified complications: Secondary | ICD-10-CM | POA: Diagnosis present

## 2018-04-29 DIAGNOSIS — I3139 Other pericardial effusion (noninflammatory): Secondary | ICD-10-CM

## 2018-04-29 DIAGNOSIS — I129 Hypertensive chronic kidney disease with stage 1 through stage 4 chronic kidney disease, or unspecified chronic kidney disease: Secondary | ICD-10-CM | POA: Diagnosis present

## 2018-04-29 DIAGNOSIS — Z9689 Presence of other specified functional implants: Secondary | ICD-10-CM

## 2018-04-29 DIAGNOSIS — D62 Acute posthemorrhagic anemia: Secondary | ICD-10-CM | POA: Diagnosis not present

## 2018-04-29 DIAGNOSIS — I2510 Atherosclerotic heart disease of native coronary artery without angina pectoris: Secondary | ICD-10-CM | POA: Diagnosis present

## 2018-04-29 DIAGNOSIS — Z7982 Long term (current) use of aspirin: Secondary | ICD-10-CM

## 2018-04-29 DIAGNOSIS — Z7984 Long term (current) use of oral hypoglycemic drugs: Secondary | ICD-10-CM

## 2018-04-29 DIAGNOSIS — I313 Pericardial effusion (noninflammatory): Principal | ICD-10-CM | POA: Diagnosis present

## 2018-04-29 DIAGNOSIS — Z9889 Other specified postprocedural states: Secondary | ICD-10-CM

## 2018-04-29 DIAGNOSIS — J90 Pleural effusion, not elsewhere classified: Secondary | ICD-10-CM | POA: Diagnosis present

## 2018-04-29 DIAGNOSIS — I340 Nonrheumatic mitral (valve) insufficiency: Secondary | ICD-10-CM

## 2018-04-29 LAB — CBC WITH DIFFERENTIAL/PLATELET
Basophils Absolute: 0 10*3/uL (ref 0.0–0.1)
Basophils Relative: 0 %
EOS ABS: 0.2 10*3/uL (ref 0.0–0.7)
EOS PCT: 2 %
HCT: 37.2 % — ABNORMAL LOW (ref 39.0–52.0)
Hemoglobin: 12.1 g/dL — ABNORMAL LOW (ref 13.0–17.0)
LYMPHS ABS: 1.1 10*3/uL (ref 0.7–4.0)
Lymphocytes Relative: 13 %
MCH: 31.3 pg (ref 26.0–34.0)
MCHC: 32.5 g/dL (ref 30.0–36.0)
MCV: 96.4 fL (ref 78.0–100.0)
MONO ABS: 0.7 10*3/uL (ref 0.1–1.0)
MONOS PCT: 9 %
NEUTROS PCT: 76 %
Neutro Abs: 6.4 10*3/uL (ref 1.7–7.7)
PLATELETS: 183 10*3/uL (ref 150–400)
RBC: 3.86 MIL/uL — ABNORMAL LOW (ref 4.22–5.81)
RDW: 14.4 % (ref 11.5–15.5)
WBC: 8.3 10*3/uL (ref 4.0–10.5)

## 2018-04-29 LAB — BASIC METABOLIC PANEL
Anion gap: 8 (ref 5–15)
BUN: 34 mg/dL — AB (ref 6–20)
CALCIUM: 8.8 mg/dL — AB (ref 8.9–10.3)
CO2: 19 mmol/L — ABNORMAL LOW (ref 22–32)
CREATININE: 1.62 mg/dL — AB (ref 0.61–1.24)
Chloride: 107 mmol/L (ref 98–111)
GFR calc non Af Amer: 46 mL/min — ABNORMAL LOW (ref >60)
GFR, EST AFRICAN AMERICAN: 53 mL/min — AB (ref >60)
GLUCOSE: 161 mg/dL — AB (ref 70–99)
Potassium: 5 mmol/L (ref 3.5–5.1)
Sodium: 134 mmol/L — ABNORMAL LOW (ref 135–145)

## 2018-04-29 LAB — GLUCOSE, CAPILLARY: GLUCOSE-CAPILLARY: 149 mg/dL — AB (ref 70–99)

## 2018-04-29 LAB — TROPONIN I: Troponin I: <0.03 ng/mL (ref <0.03)

## 2018-04-29 LAB — CBC
HEMATOCRIT: 34.9 % — AB (ref 39.0–52.0)
HEMOGLOBIN: 11 g/dL — AB (ref 13.0–17.0)
MCH: 31 pg (ref 26.0–34.0)
MCHC: 31.5 g/dL (ref 30.0–36.0)
MCV: 98.3 fL (ref 78.0–100.0)
Platelets: 175 10*3/uL (ref 150–400)
RBC: 3.55 MIL/uL — AB (ref 4.22–5.81)
RDW: 14.1 % (ref 11.5–15.5)
WBC: 8.1 10*3/uL (ref 4.0–10.5)

## 2018-04-29 LAB — CREATININE, SERUM
Creatinine, Ser: 1.77 mg/dL — ABNORMAL HIGH (ref 0.61–1.24)
GFR, EST AFRICAN AMERICAN: 47 mL/min — AB (ref >60)
GFR, EST NON AFRICAN AMERICAN: 41 mL/min — AB (ref >60)

## 2018-04-29 LAB — ECHOCARDIOGRAM LIMITED
Height: 66.5 in
Weight: 2240 oz

## 2018-04-29 LAB — BRAIN NATRIURETIC PEPTIDE: B Natriuretic Peptide: 271.1 pg/mL — ABNORMAL HIGH (ref 0.0–100.0)

## 2018-04-29 LAB — MRSA PCR SCREENING: MRSA by PCR: NEGATIVE

## 2018-04-29 MED ORDER — HEPARIN SODIUM (PORCINE) 5000 UNIT/ML IJ SOLN
5000.0000 [IU] | Freq: Three times a day (TID) | INTRAMUSCULAR | Status: DC
  Administered 2018-04-29 – 2018-04-30 (×2): 5000 [IU] via SUBCUTANEOUS
  Filled 2018-04-29 (×2): qty 1

## 2018-04-29 MED ORDER — ACETAMINOPHEN 325 MG PO TABS: 650.0000 mg | ORAL_TABLET | ORAL | Status: DC | PRN

## 2018-04-29 MED ORDER — NITROGLYCERIN 0.4 MG SL SUBL: 0.4000 mg | SUBLINGUAL_TABLET | SUBLINGUAL | Status: DC | PRN

## 2018-04-29 MED ORDER — GLIPIZIDE ER 10 MG PO TB24
10.0000 mg | ORAL_TABLET | Freq: Two times a day (BID) | ORAL | Status: DC
  Administered 2018-04-29: 10 mg via ORAL
  Filled 2018-04-29 (×3): qty 1

## 2018-04-29 MED ORDER — GUAIFENESIN-DM 100-10 MG/5ML PO SYRP
5.0000 mL | ORAL_SOLUTION | ORAL | Status: DC | PRN
Start: 2018-04-29 — End: 2018-04-30
  Administered 2018-04-29: 5 mL via ORAL
  Filled 2018-04-29: qty 5

## 2018-04-29 MED ORDER — ASPIRIN EC 325 MG PO TBEC
325.0000 mg | DELAYED_RELEASE_TABLET | Freq: Every day | ORAL | Status: DC
  Administered 2018-04-29 – 2018-04-30 (×2): 325 mg via ORAL
  Filled 2018-04-29 (×2): qty 1

## 2018-04-29 MED ORDER — METOPROLOL TARTRATE 25 MG PO TABS
25.0000 mg | ORAL_TABLET | Freq: Two times a day (BID) | ORAL | Status: DC
  Administered 2018-04-29 – 2018-04-30 (×2): 25 mg via ORAL
  Filled 2018-04-29 (×2): qty 1

## 2018-04-29 MED ORDER — INSULIN ASPART 100 UNIT/ML ~~LOC~~ SOLN: 0.0000 [IU] | Freq: Every day | SUBCUTANEOUS | Status: DC

## 2018-04-29 MED ORDER — INSULIN ASPART 100 UNIT/ML ~~LOC~~ SOLN: 0.0000 [IU] | Freq: Three times a day (TID) | SUBCUTANEOUS | Status: DC

## 2018-04-29 MED ORDER — ONDANSETRON HCL 4 MG/2ML IJ SOLN
4.0000 mg | Freq: Four times a day (QID) | INTRAMUSCULAR | Status: DC | PRN
  Administered 2018-04-30: 4 mg via INTRAVENOUS
  Filled 2018-04-29: qty 2

## 2018-04-29 MED ORDER — FUROSEMIDE 10 MG/ML IJ SOLN
40.0000 mg | Freq: Two times a day (BID) | INTRAMUSCULAR | Status: DC
  Administered 2018-04-29: 40 mg via INTRAVENOUS
  Filled 2018-04-29: qty 4

## 2018-04-29 NOTE — ED Notes (Signed)
Called [facility] and request ed to ed and urgent (thomas) no trucks available at this time

## 2018-04-29 NOTE — Progress Notes (Signed)
Cardiology Office Note:    Date:  04/29/2018   ID:  Daniel Malone, DOB 1961/06/17, MRN [id number]  PCP:  Nonnie Done., MD  Cardiologist:  Garwin Brothers, MD   Referring MD: Nonnie Done., MD    ASSESSMENT:    1. DOE (dyspnea on exertion)   2. Essential hypertension   3. Coronary artery disease involving native coronary artery of native heart without angina pectoris   4. Chronic renal disease, stage 3, moderately decreased glomerular filtration rate (GFR) between 30-59 mL/min/1.73 square meter (HCC)   5. S/P CABG x 4    PLAN:    In order of problems listed above:  1. The patient's symptoms are very concerning.  In view of the fact that he has had surgery a couple of months ago and bypass for his coronary arteries my first evaluation would be to assess his cardiovascular status.  I would also get blood work on him, chest x-ray and other evaluations as necessary. 2. In view of the above significant symptoms I walked down with the patient to the echocardiogram room to assess with a quick look of his heart.  I was not surprised to see that the patient has a significant sized pericardial effusion which I will say is moderate to large.  There was appearance of the right ventricle showing diastolic collapse.  Also the inferior vena cava was dilated and did not change diameters with respiration.  There was also suggestion of pleural effusion. 3. In view of the above I took him to the emergency room and had a lengthy discussion with the emergency room physician about his condition.  The above echocardiogram was not recorded as we only do scheduled outpatient echocardiograms in our office.  I discussed this with the emergency room physician and she will evaluate this patient and sent him to [facility] emergency room immediately for the needful such as pericardial tap. 4. I had a lengthy discussion with the patient about this at length and he vocalized understanding and was  appreciative.  During my evaluation the patient was alert awake oriented and asymptomatic at rest.  Total time for this evaluation was 1 hour and 10 minutes.   Medication Adjustments/Labs and Tests Ordered: Current medicines are reviewed at length with the patient today.  Concerns regarding medicines are outlined above.  No orders of the defined types were placed in this encounter.  No orders of the defined types were placed in this encounter.    Chief Complaint  Patient presents with  . Dizziness    Started last week, passed out, coughing, feeling worse than prior to surgery, no appetite     History of Present Illness:    Daniel Malone is a 57 y.o. male.  Patient has known coronary artery disease post CABG surgery, essential hypertension, dyslipidemia and renal insufficiency.  He mentions to me that post bypass in the month of May he has felt better.  In the past 1 to 2 weeks he has been noticing shortness of breath on exertion and this is getting worse.  No orthopnea or PND.  He also mentions of cough at night.  For this reason he is here.  His niece accompanies him for this visit.  At the time of my evaluation, the patient is alert awake oriented and in no distress.  The patient also mentions to me that he is passed out on 2 occasions the last being on Monday.  Is very concerned about the symptoms.  Past Medical History:  Diagnosis Date  . Chronic renal disease, stage 3, moderately decreased glomerular filtration rate (GFR) between 30-59 mL/min/1.73 square meter (HCC) 03/10/2018  . Diabetes mellitus, type 2 (HCC) 02/23/2018  . Hearing loss 02/23/2018  . Hypertension 02/23/2018  . MELAS syndrome (HCC) 02/23/2018  . Peripheral vascular disease (HCC) 02/23/2018    Past Surgical History:  Procedure Laterality Date  . CORONARY ARTERY BYPASS GRAFT N/A 03/12/2018   Procedure: CORONARY ARTERY BYPASS GRAFTING (CABG) x 4 WITH ENODOSCOPIC HARVESTING OF RIGHT SAPHENOUS VEIN.  LIMA TO LAD, SVG TO  DIAGONAL, SEQUENTIAL SVG TO INTERMEDIUS RAMUS AND DISTAL CIRCUMFLEX;  Surgeon: [name], MD;  Location: [facility] OR;  Service: Open Heart Surgery;  Laterality: N/A;  . LEFT HEART CATH AND CORONARY ANGIOGRAPHY N/A 03/10/2018   Procedure: LEFT HEART CATH AND CORONARY ANGIOGRAPHY;  Surgeon: Corky Crafts, MD;  Location: [facility] INVASIVE CV LAB;  Service: Cardiovascular;  Laterality: N/A;  . NASAL SINUS SURGERY    . TEE WITHOUT CARDIOVERSION N/A 03/12/2018   Procedure: TRANSESOPHAGEAL ECHOCARDIOGRAM (TEE);  Surgeon: [name], MD;  Location: [facility] OR;  Service: Open Heart Surgery;  Laterality: N/A;    Current Medications: Current Meds  Medication Sig  . aspirin EC 325 MG tablet Take 1 tablet (325 mg total) by mouth daily.  Marland Kitchen glipiZIDE (GLUCOTROL XL) 10 MG 24 hr tablet Take 10 mg by mouth 2 (two) times daily.  . metoprolol tartrate (LOPRESSOR) 25 MG tablet Take 1 tablet (25 mg total) by mouth 2 (two) times daily.  . pioglitazone (ACTOS) 30 MG tablet Take 30 mg by mouth every morning.   . sitaGLIPtin (JANUVIA) 100 MG tablet Take 100 mg by mouth every morning.      Allergies:   Patient has no known allergies.   Social History   Socioeconomic History  . Marital status: Married    Spouse name: Not on file  . Number of children: 1  . Years of education: Not on file  . Highest education level: Not on file  Occupational History  . Not on file  Social Needs  . Financial resource strain: Not on file  . Food insecurity:    Worry: Not on file    Inability: Not on file  . Transportation needs:    Medical: Not on file    Non-medical: Not on file  Tobacco Use  . Smoking status: Never Smoker  . Smokeless tobacco: Former Neurosurgeon    Types: Chew  Substance and Sexual Activity  . Alcohol use: Not Currently  . Drug use: Not Currently  . Sexual activity: Not on file  Lifestyle  . Physical activity:    Days per week: Not on file    Minutes per session: Not on file  . Stress: Not on file   Relationships  . Social connections:    Talks on phone: Not on file    Gets together: Not on file    Attends religious service: Not on file    Active member of club or organization: Not on file    Attends meetings of clubs or organizations: Not on file    Relationship status: Not on file  Other Topics Concern  . Not on file  Social History Narrative  . Not on file     Family History: The patient's family history includes CAD in his father; Diabetes in his mother.  ROS:   Please see the history of present illness.    All other systems reviewed and  are negative.  EKGs/Labs/Other Studies Reviewed:    The following studies were reviewed today: EKG reveals sinus rhythm and nonspecific ST-T changes.  Poor anterior forces.  Possible old septal myocardial infarction.   Recent Labs: 03/11/2018: ALT 16 03/13/2018: Magnesium 2.2 03/29/2018: BUN 20; Creatinine, Ser 1.39; Hemoglobin 9.9; Platelets 289; Potassium 5.3; Sodium 138  Recent Lipid Panel No results found for: CHOL, TRIG, HDL, CHOLHDL, VLDL, LDLCALC, LDLDIRECT  Physical Exam:    VS:  BP 128/82   Pulse 100   Ht 5' 6.5" (1.689 m)   Wt 140 lb (63.5 kg)   SpO2 96%   BMI 22.26 kg/m     Wt Readings from Last 3 Encounters:  04/29/18 140 lb (63.5 kg)  04/12/18 129 lb 9.6 oz (58.8 kg)  03/29/18 131 lb 12.8 oz (59.8 kg)     GEN: Patient is in no acute distress HEENT: Normal NECK: No JVD; No carotid bruits LYMPHATICS: No lymphadenopathy CARDIAC: Hear sounds regular, 2/6 systolic murmur at the apex. RESPIRATORY:  Clear to auscultation without rales, wheezing or rhonchi  ABDOMEN: Soft, non-tender, non-distended MUSCULOSKELETAL:  No edema; No deformity  SKIN: Warm and dry NEUROLOGIC:  Alert and oriented x 3 PSYCHIATRIC:  Normal affect   Signed, Garwin Brothersajan R Yuchen Fedor, MD  04/29/2018 11:49 AM    [facility]

## 2018-04-29 NOTE — Patient Instructions (Addendum)
Medication Instructions:  Your physician recommends that you continue on your current medications as directed. Please refer to the Current Medication list given to you today.   Labwork: Your physician recommends that you return for lab work at [facility]: STAT CBC, BMP, D-dimer.   Testing/Procedures: You had an EKG today.   A chest x-ray takes a picture of the organs and structures inside the chest, including the heart, lungs, and blood vessels. This test can show several things, including, whether the heart is enlarges; whether fluid is building up in the lungs; and whether pacemaker / defibrillator leads are still in place.  Your physician has requested that you have a lexiscan myoview. For further information please visit [email or web address] Please follow instruction sheet, as given.   Cardiac Nuclear Scan A cardiac nuclear scan is a test that measures blood flow to the heart when a person is resting and when he or she is exercising. The test looks for problems such as:  Not enough blood reaching a portion of the heart.  The heart muscle not working normally.  You may need this test if:  You have heart disease.  You have had abnormal lab results.  You have had heart surgery or angioplasty.  You have chest pain.  You have shortness of breath.  In this test, a radioactive dye (tracer) is injected into your bloodstream. After the tracer has traveled to your heart, an imaging device is used to measure how much of the tracer is absorbed by or distributed to various areas of your heart. This procedure is usually done at a hospital and takes 2-4 hours. Tell a health care provider about:  Any allergies you have.  All medicines you are taking, including vitamins, herbs, eye drops, creams, and over-the-counter medicines.  Any problems you or family members have had with the use of anesthetic medicines.  Any blood disorders you have.  Any surgeries you have had.  Any  medical conditions you have.  Whether you are pregnant or may be pregnant. What are the risks? Generally, this is a safe procedure. However, problems may occur, including:  Serious chest pain and heart attack. This is only a risk if the stress portion of the test is done.  Rapid heartbeat.  Sensation of warmth in your chest. This usually passes quickly.  What happens before the procedure?  Ask your health care provider about changing or stopping your regular medicines. This is especially important if you are taking diabetes medicines or blood thinners.  Remove your jewelry on the day of the procedure. What happens during the procedure?  An IV tube will be inserted into one of your veins.  Your health care provider will inject a small amount of radioactive tracer through the tube.  You will wait for 20-40 minutes while the tracer travels through your bloodstream.  Your heart activity will be monitored with an electrocardiogram (ECG).  You will lie down on an exam table.  Images of your heart will be taken for about 15-20 minutes.  You may be asked to exercise on a treadmill or stationary bike. While you exercise, your heart's activity will be monitored with an ECG, and your blood pressure will be checked. If you are unable to exercise, you may be given a medicine to increase blood flow to parts of your heart.  When blood flow to your heart has peaked, a tracer will again be injected through the IV tube.  After 20-40 minutes, you will get back on the  exam table and have more images taken of your heart.  When the procedure is over, your IV tube will be removed. The procedure may vary among health care providers and hospitals. Depending on the type of tracer used, scans may need to be repeated 3-4 hours later. What happens after the procedure?  Unless your health care provider tells you otherwise, you may return to your normal schedule, including diet, activities, and  medicines.  Unless your health care provider tells you otherwise, you may increase your fluid intake. This will help flush the contrast dye from your body. Drink enough fluid to keep your urine clear or pale yellow.  It is up to you to get your test results. Ask your health care provider, or the department that is doing the test, when your results will be ready. Summary  A cardiac nuclear scan measures the blood flow to the heart when a person is resting and when he or she is exercising.  You may need this test if you are at risk for heart disease.  Tell your health care provider if you are pregnant.  Unless your health care provider tells you otherwise, increase your fluid intake. This will help flush the contrast dye from your body. Drink enough fluid to keep your urine clear or pale yellow. This information is not intended to replace advice given to you by your health care provider. Make sure you discuss any questions you have with your health care provider. Document Released: 10/31/2004 Document Revised: 10/08/2016 Document Reviewed: 09/14/2013 Elsevier Interactive Patient Education  2017 [employer]vier [employer].  Follow-Up: Your physician recommends that you schedule a follow-up appointment: to be determined.  If you need a refill on your cardiac medications before your next appointment, please call your pharmacy.   Thank you for choosing [facility]! Mady Gemmaatherine Lockhart, RN [phone]

## 2018-04-29 NOTE — Progress Notes (Signed)
  Echocardiogram 2D Echocardiogram has been performed.  Daniel Malone G Daniel Malone 04/29/2018, 4:44 PM

## 2018-04-29 NOTE — ED Provider Notes (Signed)
[facility] EMERGENCY DEPARTMENT Provider Note   CSN: [id number] Arrival date & time: 04/29/18  1234     History   Chief Complaint Chief Complaint  Patient presents with  . Shortness of Breath    HPI Daniel Malone is a 57 y.o. male.  The history is provided by the patient. No language interpreter was used.  Shortness of Breath    Daniel Malone is a 57 y.o. male who presents to the Emergency Department complaining of sob.  He is six weeks status post cabbage. He was doing well after the procedure. About a week and a half ago he began to develop a shortness of breath with significant dyspnea on exertion. He had a syncopal events with exertion a week ago. He had another event three days ago. He reports associated nonproductive cough. He denies any fevers, night sweats, chest pain, abdominal pain, leg swelling or pain. He has been compliant with his medications since hospital discharge. No prior similar symptoms. He went to see his cardiologist today, who referred him to the emergency department for further evaluation. Past Medical History:  Diagnosis Date  . Chronic renal disease, stage 3, moderately decreased glomerular filtration rate (GFR) between 30-59 mL/min/1.73 square meter (HCC) 03/10/2018  . Diabetes mellitus, type 2 (HCC) 02/23/2018  . Hearing loss 02/23/2018  . Hypertension 02/23/2018  . MELAS syndrome (HCC) 02/23/2018  . Peripheral vascular disease (HCC) 02/23/2018    Patient Active Problem List   Diagnosis Date Noted  . DOE (dyspnea on exertion) 04/29/2018  . CAD (coronary artery disease) 03/29/2018  . 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 Chronic renal disease, stage 3, moderately decreased glomerular filtration rate (GFR) between 30-59 mL/min/1.73 square meter (HCC) 03/10/2018  . Essential hypertension 03/09/2018  . Diabetes mellitus due to underlying condition with unspecified complications (HCC) 03/09/2018  . Hearing loss 02/23/2018  . MELAS syndrome (HCC) 02/23/2018  .  Peripheral vascular disease (HCC) 02/23/2018    Past Surgical History:  Procedure Laterality Date  . CORONARY ARTERY BYPASS GRAFT N/A 03/12/2018   Procedure: CORONARY ARTERY BYPASS GRAFTING (CABG) x 4 WITH ENODOSCOPIC HARVESTING OF RIGHT SAPHENOUS VEIN.  LIMA TO LAD, SVG TO DIAGONAL, SEQUENTIAL SVG TO INTERMEDIUS RAMUS AND DISTAL CIRCUMFLEX;  Surgeon: [name], MD;  Location: [facility] OR;  Service: Open Heart Surgery;  Laterality: N/A;  . LEFT HEART CATH AND CORONARY ANGIOGRAPHY N/A 03/10/2018   Procedure: LEFT HEART CATH AND CORONARY ANGIOGRAPHY;  Surgeon: Corky Crafts, MD;  Location: [facility] INVASIVE CV LAB;  Service: Cardiovascular;  Laterality: N/A;  . NASAL SINUS SURGERY    . TEE WITHOUT CARDIOVERSION N/A 03/12/2018   Procedure: TRANSESOPHAGEAL ECHOCARDIOGRAM (TEE);  Surgeon: [name], MD;  Location: [facility] OR;  Service: Open Heart Surgery;  Laterality: N/A;        Home Medications    Prior to Admission medications   Medication Sig Start Date End Date Taking? Authorizing Provider  aspirin EC 325 MG tablet Take 1 tablet (325 mg total) by mouth daily. 03/09/18   Revankar, Aundra Dubin, MD  glipiZIDE (GLUCOTROL XL) 10 MG 24 hr tablet Take 10 mg by mouth 2 (two) times daily.    [provider]  metoprolol tartrate (LOPRESSOR) 25 MG tablet Take 1 tablet (25 mg total) by mouth 2 (two) times daily. 03/17/18   Gold, Wayne E, PA-C  pioglitazone (ACTOS) 30 MG tablet Take 30 mg by mouth every morning.     [provider]  sitaGLIPtin (JANUVIA) 100 MG  tablet Take 100 mg by mouth every morning.     [provider]    Family History Family History  Problem Relation Age of Onset  . Diabetes Mother   . CAD Father     Social History Social History   Tobacco Use  . Smoking status: Never Smoker  . Smokeless tobacco: Former Neurosurgeon    Types: Chew  Substance Use Topics  . Alcohol use: Not Currently  . Drug use: Not Currently     Allergies   Patient has no  known allergies.   Review of Systems Review of Systems  Respiratory: Positive for shortness of breath.   All other systems reviewed and are negative.    Physical Exam Updated Vital Signs BP (!) 155/105 (BP Location: Right Arm)   Pulse 95   Temp 98.6 F (37 C) (Oral)   Resp 20   Ht 5' 6.5" (1.689 m)   Wt 63.5 kg (140 lb)   SpO2 100%   BMI 22.26 kg/m   Physical Exam  Constitutional: He is oriented to person, place, and time. He appears well-developed and well-nourished.  HENT:  Head: Normocephalic and atraumatic.  Neck: JVD present.  Cardiovascular: Regular rhythm.  No murmur heard. tachycardic  Pulmonary/Chest: Effort normal. No respiratory distress.  Decreased air movement in bilateral lung bases  Abdominal: Soft. There is no tenderness. There is no rebound and no guarding.  Musculoskeletal: He exhibits no tenderness.  2+ pitting edema to RLE, 1+ pitting edema to LLE.    Neurological: He is alert and oriented to person, place, and time.  Skin: Skin is warm and dry.  Psychiatric: He has a normal mood and affect. His behavior is normal.  Nursing note and vitals reviewed.    ED Treatments / Results  Labs (all labs ordered are listed, but only abnormal results are displayed) Labs Reviewed  BASIC METABOLIC PANEL - Abnormal; Notable for the following components:      Result Value   Sodium 134 (*)    CO2 19 (*)    Glucose, Bld 161 (*)    BUN 34 (*)    Creatinine, Ser 1.62 (*)    Calcium 8.8 (*)    GFR calc non Af Amer 46 (*)    GFR calc Af Amer 53 (*)    All other components within normal limits  CBC WITH DIFFERENTIAL/PLATELET - Abnormal; Notable for the following components:   RBC 3.86 (*)    Hemoglobin 12.1 (*)    HCT 37.2 (*)    All other components within normal limits  BRAIN NATRIURETIC PEPTIDE - Abnormal; Notable for the following components:   B Natriuretic Peptide 271.1 (*)    All other components within normal limits  TROPONIN I    EKG EKG  Interpretation  Date/Time:  Thursday April 29 2018 13:12:40 EDT Ventricular Rate:  97 PR Interval:    QRS Duration: 89 QT Interval:  355 QTC Calculation: 451 R Axis:   78 Text Interpretation:  Sinus rhythm Probable anteroseptal infarct, old Nonspecific T abnormalities, lateral leads Confirmed by Tilden Fossa [id number]) on 04/29/2018 1:16:34 PM   Radiology Dg Chest 2 View  Result Date: 04/29/2018 CLINICAL DATA:  Shortness of breath. EXAM: CHEST - 2 VIEW COMPARISON:  Radiographs of April 12, 2018. FINDINGS: Stable cardiomediastinal silhouette. Status post coronary artery bypass graft. No pneumothorax is noted. Interval development of mild bilateral pleural effusions with associated subsegmental atelectasis. Bony thorax is unremarkable. IMPRESSION: Interval development of mild bilateral pleural effusions with  associated subsegmental atelectasis. Electronically Signed   By: Lupita RaiderJames  Green Jr, M.D.   On: 04/29/2018 12:54    Procedures Procedures (including critical care time)  Medications Ordered in ED Medications - No data to display   Initial Impression / Assessment and Plan / ED Course  I have reviewed the triage vital signs and the nursing notes.  Pertinent labs & imaging results that were available during my care of the patient were reviewed by me and considered in my medical decision making (see chart for details).     Patient here for progressive shortness of breath as well as syncope, six weeks following CABG. Informal ultrasound by his cardiologist concerning for possible pericardial effusion with tapenade physiology. No bedside ultrasound available in the emergency department. Discussed with Alycia Rossettiyan, [place] on-call for Dr. Lowella FairyGerhart with CT surgery. [facility] for bedside echo and CT surgery evaluation. Discussed with [facility] Donnald Gar[facility]Pfeiffer [facility] who accepts the patient in transfer. Patient updated of findings of  studies recommendation for admission and patient is in agreement with plan  Final Clinical Impressions(s) / ED Diagnoses   Final diagnoses:  None    ED Discharge Orders    None       Tilden Fossaees, Mahina Salatino, MD 04/29/18 1621

## 2018-04-29 NOTE — H&P (Addendum)
History & Physical    Patient ID: Daniel Malone MRN: [id number], DOB/AGE: November 21, 1960   Admit date: 04/29/2018   Primary Physician: Nonnie Done., MD Primary Cardiologist: Dr. Tomie China  Patient Profile    57 yo male with PMH of CAD s/p CABG x4, HTN, HL, CKD who presented to the office for follow up and reported shortness of breath. Found to have both pericardial and pleural effusions. Sent to [facility] from [facility].   Past Medical History   Past Medical History:  Diagnosis Date  . Chronic renal disease, stage 3, moderately decreased glomerular filtration rate (GFR) between 30-59 mL/min/1.73 square meter (HCC) 03/10/2018  . Diabetes mellitus, type 2 (HCC) 02/23/2018  . Hearing loss 02/23/2018  . Hypertension 02/23/2018  . MELAS syndrome (HCC) 02/23/2018  . Peripheral vascular disease (HCC) 02/23/2018    Past Surgical History:  Procedure Laterality Date  . CORONARY ARTERY BYPASS GRAFT N/A 03/12/2018   Procedure: CORONARY ARTERY BYPASS GRAFTING (CABG) x 4 WITH ENODOSCOPIC HARVESTING OF RIGHT SAPHENOUS VEIN.  LIMA TO LAD, SVG TO DIAGONAL, SEQUENTIAL SVG TO INTERMEDIUS RAMUS AND DISTAL CIRCUMFLEX;  Surgeon: [name], MD;  Location: [facility] OR;  Service: Open Heart Surgery;  Laterality: N/A;  . LEFT HEART CATH AND CORONARY ANGIOGRAPHY N/A 03/10/2018   Procedure: LEFT HEART CATH AND CORONARY ANGIOGRAPHY;  Surgeon: Corky Crafts, MD;  Location: [facility] INVASIVE CV LAB;  Service: Cardiovascular;  Laterality: N/A;  . NASAL SINUS SURGERY    . TEE WITHOUT CARDIOVERSION N/A 03/12/2018   Procedure: TRANSESOPHAGEAL ECHOCARDIOGRAM (TEE);  Surgeon: [name], MD;  Location: [facility] OR;  Service: Open Heart Surgery;  Laterality: N/A;     Allergies  No Known Allergies  History of Present Illness    Daniel Malone is a 57 yo male CAD s/p CABG x4, HTN, HL, CKD.  He is followed by Dr. Tomie China as an outpatient.  He was seen in the office on 03/09/2018 with reports of exertional chest pain.  Given his  symptoms and his risk factors he was referred for outpatient cardiac catheterization.  Underwent cardiac cath on 03/10/2018 showed multivessel disease, and he was referred for surgical consult for possible CABG.  He was seen by Dr. Tyrone Sage and felt to be a suitable candidate.  He was taken to the operating room on 03/12/2018 and underwent four-vessel bypass, with a LIMA to the LAD, SVG to diagonal, SVG to intermediate ramus and SVG to distal circumflex.  He remained hemodynamically stable post surgery.  Did have some postoperative renal insufficiency, but did improve towards the time of discharge. He was seen back in the office on 6/10 and reported doing well.   States about a week ago he developed shortness of breath, with orthopnea, and LE edema. Symptoms worsened over the past week and now he is unable to go from room to room without feeling extremely short of breath. Presented to the office today for follow up and reported symptoms. He was take down the to [facility] ED and informal echo done showing concern for pericardial effusion with possible tamponade. Case was discussed with TCTS and felt not to need surgical intervention at this time. Decision made to transfer to [facility] for admission.   In the ED at [facility] his labs showed Na+ 134, Cr 1.6, Hgb 12.1, BNP 271, Trop neg. EKG showed SR with nonspecific T wave changes. He is actually hypertensive with systolic in the 150-160 range. Limited echo in the ED reported large pleural effusion and large pericardial effusion  with RA indention, but no RV collapse. No tamponade noted. He is sitting up in bed comfortable at the time of exam.   Home Medications    Prior to Admission medications   Medication Sig Start Date End Date Taking? Authorizing Provider  aspirin EC 325 MG tablet Take 1 tablet (325 mg total) by mouth daily. 03/09/18  Yes Revankar, Aundra Dubin, MD  glipiZIDE (GLUCOTROL XL) 10 MG 24 hr tablet Take 10 mg by mouth 2 (two) times daily.   Yes [provider]  metoprolol tartrate (LOPRESSOR) 25 MG tablet Take 1 tablet (25 mg total) by mouth 2 (two) times daily. 03/17/18  Yes Gold, Wayne E, PA-C  pioglitazone (ACTOS) 30 MG tablet Take 30 mg by mouth every morning.    Yes [provider]  sitaGLIPtin (JANUVIA) 100 MG tablet Take 100 mg by mouth every morning.    Yes [provider]    Family History    Family History  Problem Relation Age of Onset  . Diabetes Mother   . CAD Father     Social History    Social History   Socioeconomic History  . Marital status: Married    Spouse name: Not on file  . Number of children: 1  . Years of education: Not on file  . Highest education level: Not on file  Occupational History  . Not on file  Social Needs  . Financial resource strain: Not on file  . Food insecurity:    Worry: Not on file    Inability: Not on file  . Transportation needs:    Medical: Not on file    Non-medical: Not on file  Tobacco Use  . Smoking status: Never Smoker  . Smokeless tobacco: Former Neurosurgeon    Types: Chew  Substance and Sexual Activity  . Alcohol use: Not Currently  . Drug use: Not Currently  . Sexual activity: Not on file  Lifestyle  . Physical activity:    Days per week: Not on file    Minutes per session: Not on file  . Stress: Not on file  Relationships  . Social connections:    Talks on phone: Not on file    Gets together: Not on file    Attends religious service: Not on file    Active member of club or organization: Not on file    Attends meetings of clubs or organizations: Not on file    Relationship status: Not on file  . Intimate partner violence:    Fear of current or ex partner: Not on file    Emotionally abused: Not on file    Physically abused: Not on file    Forced sexual activity: Not on file  Other Topics Concern  . Not on file  Social History Narrative  . Not on file     Review of Systems    See HPI  All other systems reviewed and are  otherwise negative except as noted above.  Physical Exam    Blood pressure (!) 167/96, pulse (!) 106, temperature 98.1 F (36.7 C), temperature source Oral, resp. rate (!) 23, height 5' 6.5" (1.689 m), weight 140 lb (63.5 kg), SpO2 100 %.  General: Pleasant, WM, NAD Psych: Normal affect. Neuro: Alert and oriented X 3. Moves all extremities spontaneously. HEENT: Normal  Neck: Supple, no JVD. Lungs:  Resp regular and unlabored, CTA. Heart: RRR no s3, s4, or murmurs. Abdomen: Soft, non-tender, non-distended, BS + x 4.  Extremities: No clubbing,  cyanosis or edema. DP/PT/Radials 2+ and equal bilaterally.  Labs    Troponin (Point of Care Test) No results for input(s): TROPIPOC in the last 72 hours. Recent Labs    04/29/18 1300  TROPONINI <0.03   Lab Results  Component Value Date   WBC 8.3 04/29/2018   HGB 12.1 (L) 04/29/2018   HCT 37.2 (L) 04/29/2018   MCV 96.4 04/29/2018   PLT 183 04/29/2018    Recent Labs  Lab 04/29/18 1300  NA 134*  K 5.0  CL 107  CO2 19*  BUN 34*  CREATININE 1.62*  CALCIUM 8.8*  GLUCOSE 161*   No results found for: CHOL, HDL, LDLCALC, TRIG No results found for: [facility]   Radiology Studies    Dg Chest 2 View  Result Date: 04/29/2018 CLINICAL DATA:  Shortness of breath. EXAM: CHEST - 2 VIEW COMPARISON:  Radiographs of April 12, 2018. FINDINGS: Stable cardiomediastinal silhouette. Status post coronary artery bypass graft. No pneumothorax is noted. Interval development of mild bilateral pleural effusions with associated subsegmental atelectasis. Bony thorax is unremarkable. IMPRESSION: Interval development of mild bilateral pleural effusions with associated subsegmental atelectasis. Electronically Signed   By: Lupita RaiderJames  Green Jr, M.D.   On: 04/29/2018 12:54   Dg Chest 2 View  Result Date: 04/12/2018 CLINICAL DATA:  Status post CABG on Mar 12, 2018. No current chest complaints. EXAM: CHEST - 2 VIEW COMPARISON:  PA and lateral chest x-ray of Mar 17, 2018  FINDINGS: The lungs are well-expanded and clear. The heart and pulmonary vascularity are normal. The mediastinum is normal in width. The sternal wires are intact. There is no pleural effusion or pneumothorax. The bony thorax is unremarkable. IMPRESSION: There is no residual pleural effusion nor other cardiopulmonary abnormality. Electronically Signed   By: David  SwazilandJordan M.D.   On: 04/12/2018 14:07    ECG & Cardiac Imaging    EKG: SR  Echo:  04/29/18  Study Conclusions  - Left ventricle: The cavity size was normal. Wall thickness was   normal. Systolic function was normal. The estimated ejection   fraction was in the range of 55% to 60%. - Mitral valve: There was mild regurgitation. - Pulmonary arteries: PA peak pressure: 36 mm Hg (S). - Pericardium, extracardiac: Large pleural effusion. Large   pericardial effusion, seen best in apical views. Largest   collection around RA. Maximal 18 mm Possibly larger.   There is RA indentation. but no RV collapse. Mitral inflow is   without significant respiratory variation. IVC collapses normally   with inspiration. Overall does not meet criteria for tamponade by   echo criteria There was a left pleural effusion.  Assessment & Plan    57 yo male with PMH of CAD s/p CABG x4, HTN, HL, CKD who presented to the office for follow up and reported shortness of breath. Found to have both pericardial and pleural effusions. [facility]   1. Large pericardial effusion and pleural effusion: Noted on echo. Symptoms of dyspnea for the past week. He is hemodynamically stable at the time of exam. Actually hypertensive at this time. Echo reviewed by Dr. Elease HashimotoNahser and no tamponade noted.  -- admit to ICU -- start IV lasix 40mg  BID. Will attempt to diuresis and monitor his response. If unsuccessful may need further invasive procedures.  -- repeat echo in the morning.   2. CAD s/p CABG: done on 03/12/18. Will continue ASA, and BB therapy  3. CKD: baseline  appears around 1.3-1.4. Will monitor with  IV diuresis.  -- BMET in the am   Severity of Illness: The appropriate patient status for this patient is INPATIENT. Inpatient status is judged to be reasonable and necessary in order to provide the required intensity of service to ensure the patient's safety. The patient's presenting symptoms, physical exam findings, and initial radiographic and laboratory data in the context of their chronic comorbidities is felt to place them at high risk for further clinical deterioration. Furthermore, it is not anticipated that the patient will be medically stable for discharge from the hospital within 2 midnights of admission. The following factors support the patient status of inpatient.   " The patient's presenting symptoms include shortness of breath. " The worrisome physical exam findings include normal exam. " The initial radiographic and laboratory data are worrisome because of large pericardial effusion on echo. " The chronic co-morbidities include CAD s/p recent CABG.   * I certify that at the point of admission it is my clinical judgment that the patient will require inpatient hospital care spanning beyond 2 midnights from the point of admission due to high intensity of service, high risk for further deterioration and high frequency of surveillance required.*   Signed, Laverda Page, NP-C Pager [phone] 04/29/2018, 6:10 PM   Attending Note:   The patient was seen and examined.  Agree with assessment and plan as noted above.  Changes made to the above note as needed.  Patient seen and independently examined with Laverda Page, NP .   We discussed all aspects of the encounter. I agree with the assessment and plan as stated above.  1.   Pericardial effusion: The patient has had progressive shortness of breath for the past week or so.  He is 6 weeks out from bypass surgery.  Echocardiogram today shows a large pericardial effusion.  It appears to  be loculated and not free-flowing.  He also has a moderate-sized pleural effusion.  Currently there is no signs of cardiac tamponade.  We will try diuresing him to see if we can help alleviate this pleural effusion and pericardial effusion.  We will repeat the echocardiogram tomorrow.  If the pericardial effusion is indeed loculated, we are likely to need a pericardial window.  2.  Coronary artery disease: The patient has not had any episodes of angina.  3.  Mild to moderate renal insufficiency: We will need to watch his renal function closely as we diurese him.    I have spent a total of 40 minutes with patient reviewing hospital  notes , telemetry, EKGs, labs and examining patient as well as establishing an assessment and plan that was discussed with the patient. > 50% of time was spent in direct patient care.    Vesta Mixer, Montez Hageman., MD, [facility] 04/29/2018, 6:47 PM [address]  [address] Office [phone] Pager [phone]

## 2018-04-29 NOTE — Addendum Note (Signed)
Addended by: Crist FatLOCKHART, CATHERINE P on: 04/29/2018 02:23 PM   Modules accepted: Orders

## 2018-04-29 NOTE — ED Provider Notes (Signed)
Received patient in transfer from [facility].  Concern for pericardial effusion and bilateral pleural effusion recently post CABG. sent here for emergent echocardiogram.  Echocardiogram ordered upon patient arrival.  He feels fine at rest, having no complaints currently.  Initial views while was in the room with the technician with no significant pericardial effusion.  I discussed with cardiothoracic surgery who will evaluate the images.   Called back by Alycia Rossettiyan who is the PA for Dr. Tyrone SageGerhardt, at this point they feel that there is no surgical issue, recommended cardiology admission if needed.  Will page cardiology.  I received a call from the cardiologist reading the echocardiograms, she felt there was a fairly large pericardial effusion without tamponade physiology.  She felt if the patient came to the hospital they should likely receive recurrent echocardiograms to evaluate the change.  The patients results and plan were reviewed and discussed.   Any x-rays performed were independently reviewed by myself.   Differential diagnosis were considered with the presenting HPI.  Medications - No data to display  Vitals:   04/29/18 1600 04/29/18 1630 04/29/18 1645 04/29/18 1700  BP: (!) 153/119 (!) 151/98 (!) 162/103 (!) 157/101  Pulse: (!) 103 (!) 101 (!) 102 (!) 101  Resp: 18 20 19 19   Temp:      TempSrc:      SpO2: 100% 100% 100% 100%  Weight:      Height:        Final diagnoses:  Pericardial effusion    Admission/ observation were discussed with the admitting physician, patient and/or family and they are comfortable with the plan.     Melene PlanFloyd, Ponciano Shealy, DO 04/29/18 1734

## 2018-04-29 NOTE — ED Triage Notes (Addendum)
Pt had CABG 6 weeks ago. Has been having SOB for the past week. Seen by doctor upstairs today and sent down for eval. Denies chest pain. Also c/o cough

## 2018-04-30 ENCOUNTER — Inpatient Hospital Stay (HOSPITAL_COMMUNITY): Payer: Self-pay

## 2018-04-30 ENCOUNTER — Inpatient Hospital Stay (HOSPITAL_COMMUNITY): Payer: Self-pay | Admitting: Anesthesiology

## 2018-04-30 ENCOUNTER — Encounter (HOSPITAL_COMMUNITY): Payer: Self-pay | Admitting: Orthopedic Surgery

## 2018-04-30 ENCOUNTER — Encounter (HOSPITAL_COMMUNITY)
Admission: EM | Disposition: A | Discharge status: Home / Self Care | Payer: Self-pay | Source: Home / Self Care | Attending: Cardiovascular Disease

## 2018-04-30 DIAGNOSIS — I313 Pericardial effusion (noninflammatory): Secondary | ICD-10-CM

## 2018-04-30 HISTORY — PX: TEE WITHOUT CARDIOVERSION: SHX5443

## 2018-04-30 HISTORY — PX: SUBXYPHOID PERICARDIAL WINDOW: SHX5075

## 2018-04-30 LAB — URINALYSIS, ROUTINE W REFLEX MICROSCOPIC
Bacteria, UA: NONE SEEN
Bilirubin Urine: NEGATIVE
Glucose, UA: NEGATIVE mg/dL
Hgb urine dipstick: NEGATIVE
KETONES UR: 5 mg/dL — AB
Leukocytes, UA: NEGATIVE
Nitrite: NEGATIVE
PH: 5 (ref 5.0–8.0)
Protein, ur: 30 mg/dL — AB
SPECIFIC GRAVITY, URINE: 1.017 (ref 1.005–1.030)

## 2018-04-30 LAB — POCT I-STAT 3, ART BLOOD GAS (G3+)
ACID-BASE DEFICIT: 7 mmol/L — AB (ref 0.0–2.0)
BICARBONATE: 17.4 mmol/L — AB (ref 20.0–28.0)
O2 Saturation: 94 %
PCO2 ART: 29.8 mmHg — AB (ref 32.0–48.0)
PH ART: 7.372 (ref 7.350–7.450)
PO2 ART: 69 mmHg — AB (ref 83.0–108.0)
Patient temperature: 97.4
TCO2: 18 mmol/L — ABNORMAL LOW (ref 22–32)

## 2018-04-30 LAB — CBC
HCT: 34.8 % — ABNORMAL LOW (ref 39.0–52.0)
Hemoglobin: 10.7 g/dL — ABNORMAL LOW (ref 13.0–17.0)
MCH: 30.3 pg (ref 26.0–34.0)
MCHC: 30.7 g/dL (ref 30.0–36.0)
MCV: 98.6 fL (ref 78.0–100.0)
PLATELETS: 169 10*3/uL (ref 150–400)
RBC: 3.53 MIL/uL — ABNORMAL LOW (ref 4.22–5.81)
RDW: 14.2 % (ref 11.5–15.5)
WBC: 7.2 10*3/uL (ref 4.0–10.5)

## 2018-04-30 LAB — BASIC METABOLIC PANEL
Anion gap: 8 (ref 5–15)
BUN: 27 mg/dL — AB (ref 6–20)
CALCIUM: 9.1 mg/dL (ref 8.9–10.3)
CHLORIDE: 109 mmol/L (ref 98–111)
CO2: 21 mmol/L — ABNORMAL LOW (ref 22–32)
Creatinine, Ser: 1.73 mg/dL — ABNORMAL HIGH (ref 0.61–1.24)
GFR calc non Af Amer: 42 mL/min — ABNORMAL LOW (ref >60)
GFR, EST AFRICAN AMERICAN: 49 mL/min — AB (ref >60)
Glucose, Bld: 149 mg/dL — ABNORMAL HIGH (ref 70–99)
POTASSIUM: 5.3 mmol/L — AB (ref 3.5–5.1)
SODIUM: 138 mmol/L (ref 135–145)

## 2018-04-30 LAB — ECHOCARDIOGRAM LIMITED
Height: 66.5 in
Weight: 2165.8 oz

## 2018-04-30 LAB — GLUCOSE, CAPILLARY
GLUCOSE-CAPILLARY: 124 mg/dL — AB (ref 70–99)
GLUCOSE-CAPILLARY: 98 mg/dL (ref 70–99)
GLUCOSE-CAPILLARY: 104 mg/dL — AB (ref 70–99)
GLUCOSE-CAPILLARY: 110 mg/dL — AB (ref 70–99)
Glucose-Capillary: 105 mg/dL — ABNORMAL HIGH (ref 70–99)
Glucose-Capillary: 196 mg/dL — ABNORMAL HIGH (ref 70–99)
Glucose-Capillary: 93 mg/dL (ref 70–99)

## 2018-04-30 LAB — BODY FLUID CELL COUNT WITH DIFFERENTIAL
Eos, Fluid: 0 %
Lymphs, Fluid: 93 %
Monocyte-Macrophage-Serous Fluid: 2 % — ABNORMAL LOW (ref 50–90)
Neutrophil Count, Fluid: 5 % (ref 0–25)
Other Cells, Fluid: 0 %
Total Nucleated Cell Count, Fluid: 455 cu mm (ref 0–1000)

## 2018-04-30 LAB — TYPE AND SCREEN
ABO/RH(D): O NEG
ANTIBODY SCREEN: NEGATIVE

## 2018-04-30 LAB — HIV ANTIBODY (ROUTINE TESTING W REFLEX): HIV SCREEN 4TH GENERATION: NONREACTIVE

## 2018-04-30 LAB — ECHO INTRAOPERATIVE TEE
Height: 66.496 in
Weight: 2165.8 oz

## 2018-04-30 LAB — SURGICAL PCR SCREEN
MRSA, PCR: NEGATIVE
Staphylococcus aureus: NEGATIVE

## 2018-04-30 LAB — PROTIME-INR
INR: 1.11
PROTHROMBIN TIME: 14.2 s (ref 11.4–15.2)

## 2018-04-30 LAB — SEDIMENTATION RATE: Sed Rate: 12 mm/hr (ref 0–16)

## 2018-04-30 LAB — APTT: APTT: 31 s (ref 24–36)

## 2018-04-30 SURGERY — CREATION, PERICARDIAL WINDOW, SUBXIPHOID APPROACH
Anesthesia: General | Site: Chest

## 2018-04-30 MED ORDER — LACTATED RINGERS IV SOLN
INTRAVENOUS | Status: DC
  Administered 2018-04-30: 15:00:00 via INTRAVENOUS

## 2018-04-30 MED ORDER — ROCURONIUM BROMIDE 100 MG/10ML IV SOLN
INTRAVENOUS | Status: DC | PRN
  Administered 2018-04-30 (×2): 50 mg via INTRAVENOUS

## 2018-04-30 MED ORDER — FENTANYL CITRATE (PF) 250 MCG/5ML IJ SOLN
INTRAMUSCULAR | Status: AC
  Filled 2018-04-30: qty 5

## 2018-04-30 MED ORDER — ONDANSETRON HCL 4 MG/2ML IJ SOLN
INTRAMUSCULAR | Status: DC | PRN
  Administered 2018-04-30: 4 mg via INTRAVENOUS

## 2018-04-30 MED ORDER — MIDAZOLAM HCL 5 MG/5ML IJ SOLN
INTRAMUSCULAR | Status: DC | PRN
  Administered 2018-04-30 (×2): 1 mg via INTRAVENOUS

## 2018-04-30 MED ORDER — KETOROLAC TROMETHAMINE 30 MG/ML IJ SOLN
INTRAMUSCULAR | Status: AC
  Filled 2018-04-30: qty 1

## 2018-04-30 MED ORDER — ONDANSETRON HCL 4 MG/2ML IJ SOLN
INTRAMUSCULAR | Status: AC
  Filled 2018-04-30: qty 4

## 2018-04-30 MED ORDER — ACETAMINOPHEN 160 MG/5ML PO SOLN: 1000.0000 mg | Freq: Four times a day (QID) | ORAL | Status: DC

## 2018-04-30 MED ORDER — FENTANYL CITRATE (PF) 100 MCG/2ML IJ SOLN
INTRAMUSCULAR | Status: DC | PRN
  Administered 2018-04-30 (×2): 50 ug via INTRAVENOUS
  Administered 2018-04-30: 150 ug via INTRAVENOUS
  Administered 2018-04-30: 100 ug via INTRAVENOUS

## 2018-04-30 MED ORDER — SODIUM CHLORIDE 0.9 % IJ SOLN
INTRAMUSCULAR | Status: AC
  Filled 2018-04-30: qty 10

## 2018-04-30 MED ORDER — MIDAZOLAM HCL 2 MG/2ML IJ SOLN
INTRAMUSCULAR | Status: AC
  Filled 2018-04-30: qty 2

## 2018-04-30 MED ORDER — SUGAMMADEX SODIUM 200 MG/2ML IV SOLN
INTRAVENOUS | Status: DC | PRN
  Administered 2018-04-30: 125 mg via INTRAVENOUS

## 2018-04-30 MED ORDER — BISACODYL 5 MG PO TBEC
10.0000 mg | DELAYED_RELEASE_TABLET | Freq: Every day | ORAL | Status: DC
  Administered 2018-05-01 – 2018-05-02 (×2): 10 mg via ORAL
  Filled 2018-04-30 (×3): qty 2

## 2018-04-30 MED ORDER — LIDOCAINE 2% (20 MG/ML) 5 ML SYRINGE
INTRAMUSCULAR | Status: AC
  Filled 2018-04-30: qty 5

## 2018-04-30 MED ORDER — ENOXAPARIN SODIUM 40 MG/0.4ML ~~LOC~~ SOLN: 40.0000 mg | Freq: Every day | SUBCUTANEOUS | Status: DC

## 2018-04-30 MED ORDER — HYDROMORPHONE HCL 1 MG/ML IJ SOLN: 0.2500 mg | INTRAMUSCULAR | Status: DC | PRN

## 2018-04-30 MED ORDER — DEXAMETHASONE SODIUM PHOSPHATE 10 MG/ML IJ SOLN
INTRAMUSCULAR | Status: AC
  Filled 2018-04-30: qty 2

## 2018-04-30 MED ORDER — FENTANYL CITRATE (PF) 100 MCG/2ML IJ SOLN
25.0000 ug | INTRAMUSCULAR | Status: DC | PRN
  Administered 2018-04-30 – 2018-05-02 (×2): 25 ug via INTRAVENOUS
  Filled 2018-04-30 (×2): qty 2

## 2018-04-30 MED ORDER — SUGAMMADEX SODIUM 200 MG/2ML IV SOLN
INTRAVENOUS | Status: AC
  Filled 2018-04-30: qty 2

## 2018-04-30 MED ORDER — DEXAMETHASONE SODIUM PHOSPHATE 10 MG/ML IJ SOLN
INTRAMUSCULAR | Status: AC
  Filled 2018-04-30: qty 1

## 2018-04-30 MED ORDER — EPHEDRINE SULFATE 50 MG/ML IJ SOLN
INTRAMUSCULAR | Status: AC
  Filled 2018-04-30: qty 1

## 2018-04-30 MED ORDER — PROPOFOL 10 MG/ML IV BOLUS
INTRAVENOUS | Status: AC
  Filled 2018-04-30: qty 20

## 2018-04-30 MED ORDER — CEFAZOLIN SODIUM-DEXTROSE 2-4 GM/100ML-% IV SOLN
2.0000 g | INTRAVENOUS | Status: AC
  Administered 2018-04-30: 2 g via INTRAVENOUS
  Filled 2018-04-30: qty 100

## 2018-04-30 MED ORDER — POTASSIUM CHLORIDE 10 MEQ/50ML IV SOLN: 10.0000 meq | Freq: Every day | INTRAVENOUS | Status: DC | PRN

## 2018-04-30 MED ORDER — DEXTROSE-NACL 5-0.45 % IV SOLN
INTRAVENOUS | Status: DC
  Administered 2018-04-30: 19:00:00 via INTRAVENOUS
  Administered 2018-05-01: 50 mL/h via INTRAVENOUS

## 2018-04-30 MED ORDER — ACETAMINOPHEN 500 MG PO TABS
1000.0000 mg | ORAL_TABLET | Freq: Four times a day (QID) | ORAL | Status: DC
  Administered 2018-04-30 – 2018-05-03 (×11): 1000 mg via ORAL
  Filled 2018-04-30 (×11): qty 2

## 2018-04-30 MED ORDER — PROMETHAZINE HCL 25 MG/ML IJ SOLN: 6.2500 mg | INTRAMUSCULAR | Status: DC | PRN

## 2018-04-30 MED ORDER — ONDANSETRON HCL 4 MG/2ML IJ SOLN: 4.0000 mg | Freq: Four times a day (QID) | INTRAMUSCULAR | Status: DC | PRN

## 2018-04-30 MED ORDER — PHENYLEPHRINE HCL 10 MG/ML IJ SOLN
INTRAMUSCULAR | Status: DC | PRN
  Administered 2018-04-30 (×2): 80 ug via INTRAVENOUS

## 2018-04-30 MED ORDER — PROPOFOL 10 MG/ML IV BOLUS
INTRAVENOUS | Status: DC | PRN
  Administered 2018-04-30: 100 mg via INTRAVENOUS

## 2018-04-30 MED ORDER — LIDOCAINE HCL (CARDIAC) PF 100 MG/5ML IV SOSY
PREFILLED_SYRINGE | INTRAVENOUS | Status: DC | PRN
  Administered 2018-04-30: 100 mg via INTRAVENOUS

## 2018-04-30 MED ORDER — MEPERIDINE HCL 50 MG/ML IJ SOLN: 6.2500 mg | INTRAMUSCULAR | Status: DC | PRN

## 2018-04-30 MED ORDER — EPHEDRINE SULFATE 50 MG/ML IJ SOLN
INTRAMUSCULAR | Status: DC | PRN
  Administered 2018-04-30: 10 mg via INTRAVENOUS

## 2018-04-30 MED ORDER — ROCURONIUM BROMIDE 10 MG/ML (PF) SYRINGE
PREFILLED_SYRINGE | INTRAVENOUS | Status: AC
  Filled 2018-04-30: qty 30

## 2018-04-30 MED ORDER — SODIUM CHLORIDE 0.9 % IV SOLN
INTRAVENOUS | Status: DC | PRN
  Administered 2018-04-30: 50 ug/min via INTRAVENOUS

## 2018-04-30 MED ORDER — 0.9 % SODIUM CHLORIDE (POUR BTL) OPTIME
TOPICAL | Status: DC | PRN
  Administered 2018-04-30 (×2): 1000 mL

## 2018-04-30 MED ORDER — INSULIN ASPART 100 UNIT/ML ~~LOC~~ SOLN
0.0000 [IU] | SUBCUTANEOUS | Status: DC
  Administered 2018-05-01: 4 [IU] via SUBCUTANEOUS
  Administered 2018-05-01: 8 [IU] via SUBCUTANEOUS
  Administered 2018-05-01 (×2): 4 [IU] via SUBCUTANEOUS

## 2018-04-30 MED ORDER — TRAMADOL HCL 50 MG PO TABS
50.0000 mg | ORAL_TABLET | Freq: Four times a day (QID) | ORAL | Status: DC | PRN
  Administered 2018-05-01: 100 mg via ORAL
  Filled 2018-04-30: qty 2

## 2018-04-30 MED ORDER — SENNOSIDES-DOCUSATE SODIUM 8.6-50 MG PO TABS
1.0000 | ORAL_TABLET | Freq: Every day | ORAL | Status: DC
  Administered 2018-04-30 – 2018-05-02 (×3): 1 via ORAL
  Filled 2018-04-30 (×3): qty 1

## 2018-04-30 MED ORDER — OXYCODONE HCL 5 MG PO TABS
5.0000 mg | ORAL_TABLET | ORAL | Status: DC | PRN
  Administered 2018-04-30: 10 mg via ORAL
  Administered 2018-05-01: 5 mg via ORAL
  Administered 2018-05-01 – 2018-05-02 (×2): 10 mg via ORAL
  Filled 2018-04-30: qty 1
  Filled 2018-04-30 (×3): qty 2

## 2018-04-30 MED ORDER — CEFAZOLIN SODIUM-DEXTROSE 2-4 GM/100ML-% IV SOLN
2.0000 g | Freq: Three times a day (TID) | INTRAVENOUS | Status: AC
  Administered 2018-04-30 – 2018-05-01 (×2): 2 g via INTRAVENOUS
  Filled 2018-04-30 (×3): qty 100

## 2018-04-30 SURGICAL SUPPLY — 50 items
BLADE STERNUM SYSTEM 6 (BLADE) ×3 IMPLANT
CANISTER SUCT 3000ML PPV (MISCELLANEOUS) ×3 IMPLANT
CATH THORACIC 28FR (CATHETERS) IMPLANT
CATH THORACIC 28FR RT ANG (CATHETERS) IMPLANT
CONN ST 1/4X3/8  BEN (MISCELLANEOUS) ×1
CONN ST 1/4X3/8 BEN (MISCELLANEOUS) ×2 IMPLANT
CONT SPEC 4OZ CLIKSEAL STRL BL (MISCELLANEOUS) ×3 IMPLANT
DERMABOND ADVANCED (GAUZE/BANDAGES/DRESSINGS) ×1
DERMABOND ADVANCED .7 DNX12 (GAUZE/BANDAGES/DRESSINGS) ×2 IMPLANT
DRAIN CHANNEL 28F RND 3/8 FF (WOUND CARE) IMPLANT
DRAPE LAPAROSCOPIC ABDOMINAL (DRAPES) ×3 IMPLANT
ELECT BLADE 4.0 EZ CLEAN MEGAD (MISCELLANEOUS) ×3
ELECT BLADE 6.5 EXT (BLADE) ×3 IMPLANT
ELECT REM PT RETURN 9FT ADLT (ELECTROSURGICAL) ×6
ELECTRODE BLDE 4.0 EZ CLN MEGD (MISCELLANEOUS) ×2 IMPLANT
ELECTRODE REM PT RTRN 9FT ADLT (ELECTROSURGICAL) ×4 IMPLANT
GAUZE SPONGE 4X4 12PLY STRL (GAUZE/BANDAGES/DRESSINGS) ×3 IMPLANT
GLOVE BIO SURGEON STRL SZ 6.5 (GLOVE) ×3 IMPLANT
GLOVE BIOGEL PI IND STRL 6.5 (GLOVE) ×4 IMPLANT
GLOVE BIOGEL PI IND STRL 8 (GLOVE) ×4 IMPLANT
GLOVE BIOGEL PI INDICATOR 6.5 (GLOVE) ×2
GLOVE BIOGEL PI INDICATOR 8 (GLOVE) ×2
GLOVE ECLIPSE 8.0 STRL XLNG CF (GLOVE) ×9 IMPLANT
GOWN STRL REUS W/ TWL LRG LVL3 (GOWN DISPOSABLE) ×2 IMPLANT
GOWN STRL REUS W/TWL 2XL LVL3 (GOWN DISPOSABLE) ×3 IMPLANT
GOWN STRL REUS W/TWL LRG LVL3 (GOWN DISPOSABLE) ×1
HEMOSTAT POWDER SURGIFOAM 1G (HEMOSTASIS) IMPLANT
KIT BASIN OR (CUSTOM PROCEDURE TRAY) ×3 IMPLANT
KIT TURNOVER KIT B (KITS) ×3 IMPLANT
NS IRRIG 1000ML POUR BTL (IV SOLUTION) ×6 IMPLANT
PACK CHEST (CUSTOM PROCEDURE TRAY) ×3 IMPLANT
PAD ARMBOARD 7.5X6 YLW CONV (MISCELLANEOUS) ×6 IMPLANT
PAD ELECT DEFIB RADIOL ZOLL (MISCELLANEOUS) ×3 IMPLANT
SUT SILK  1 MH (SUTURE) ×2
SUT SILK 1 MH (SUTURE) ×4 IMPLANT
SUT VIC AB 1 CTX 18 (SUTURE) IMPLANT
SUT VIC AB 2-0 CTX 27 (SUTURE) IMPLANT
SUT VIC AB 3-0 SH 8-18 (SUTURE) ×3 IMPLANT
SUT VIC AB 3-0 X1 27 (SUTURE) IMPLANT
SWAB COLLECTION DEVICE MRSA (MISCELLANEOUS) ×6 IMPLANT
SWAB CULTURE ESWAB REG 1ML (MISCELLANEOUS) ×3 IMPLANT
SYR 10ML LL (SYRINGE) IMPLANT
SYR 50ML SLIP (SYRINGE) IMPLANT
SYSTEM SAHARA CHEST DRAIN ATS (WOUND CARE) IMPLANT
TAPE CLOTH SURG 4X10 WHT LF (GAUZE/BANDAGES/DRESSINGS) ×3 IMPLANT
TOWEL GREEN STERILE (TOWEL DISPOSABLE) ×3 IMPLANT
TOWEL GREEN STERILE FF (TOWEL DISPOSABLE) ×3 IMPLANT
TRAP SPECIMEN MUCOUS 40CC (MISCELLANEOUS) ×9 IMPLANT
TRAY FOLEY SLVR 16FR TEMP STAT (SET/KITS/TRAYS/PACK) ×3 IMPLANT
WATER STERILE IRR 1000ML POUR (IV SOLUTION) ×6 IMPLANT

## 2018-04-30 NOTE — Consult Note (Addendum)
[address]       [place] [address]             [phone]        [facility] Medical Record #[id number] Date of Birth: [birth year]  Referring: No ref. provider found Primary Care: Nonnie Done., MD Primary Cardiologist:Rajan Jones Broom, MD  Chief Complaint:    Chief Complaint  Patient presents with  . Shortness of Breath    History of Present Illness:     Mr. Fester is a 57 year old male well-known to our service who recently underwent coronary bypass grafting x4 on 03/12/2018 with Dr. Tyrone Sage.  He has a past medical history significant for hypertension, hyperlipidemia, and chronic kidney disease.  He returned to our office on April 12, 2018 and was doing quite well.  He reported his breathing had improved and he did not have any chest pain, palpitations, or fever at that time.  Yesterday, he reported to the emergency department with a week of shortness of breath, orthopnea, and lower extremity edema.  He states that the symptoms worsen throughout the week.  He was subsequently admitted for work-up of his symptoms.  He underwent an echocardiogram on 04/29/2018 which showed a large pericardial effusion.  He also had bilateral mild pleural effusions captured on chest x-ray.  Cardiology initiated IV diuretics and plans to repeat an echocardiogram this morning.  The patient remains hemodynamically stable without tamponade symptoms. He is currently NPO awaiting his echocardiogram results.     Current Activity/ Functional Status: Patient was independent with mobility/ambulation, transfers, ADL's, IADL's.   Zubrod Score: At the time of surgery this patient's most appropriate activity status/level should be described as: []     0    Normal activity, no symptoms [x]     1    Restricted in physical strenuous activity but ambulatory, able to do out light work []     2    Ambulatory and capable of self care, unable to do work activities, up and about                  more than 50%  Of the time                            []     3    Only limited self care, in bed greater than 50% of waking hours []     4    Completely disabled, no self care, confined to bed or chair []     5    Moribund  Past Medical History:  Diagnosis Date  . Chronic renal disease, stage 3, moderately decreased glomerular filtration rate (GFR) between 30-59 mL/min/1.73 square meter (HCC) 03/10/2018  . Diabetes mellitus, type 2 (HCC) 02/23/2018  . Hearing loss 02/23/2018  . Hypertension 02/23/2018  . MELAS syndrome (HCC) 02/23/2018  . Peripheral vascular disease (HCC) 02/23/2018    Past Surgical History:  Procedure Laterality Date  . CORONARY ARTERY BYPASS GRAFT N/A 03/12/2018   Procedure: CORONARY ARTERY BYPASS GRAFTING (CABG) x 4 WITH ENODOSCOPIC HARVESTING OF RIGHT SAPHENOUS VEIN.  LIMA TO LAD, SVG TO DIAGONAL, SEQUENTIAL SVG TO INTERMEDIUS RAMUS AND DISTAL CIRCUMFLEX;  Surgeon: [name], MD;  Location: [facility] OR;  Service: Open Heart Surgery;  Laterality: N/A;  . LEFT HEART CATH AND CORONARY ANGIOGRAPHY N/A 03/10/2018   Procedure: LEFT HEART CATH AND CORONARY ANGIOGRAPHY;  Surgeon: Corky Crafts,  MD;  Location: [facility] INVASIVE CV LAB;  Service: Cardiovascular;  Laterality: N/A;  . NASAL SINUS SURGERY    . TEE WITHOUT CARDIOVERSION N/A 03/12/2018   Procedure: TRANSESOPHAGEAL ECHOCARDIOGRAM (TEE);  Surgeon: Delight OvensGerhardt, Simone Tuckey B, MD;  [facility];  Service: Open Heart Surgery;  Laterality: N/A;    Social History   Tobacco Use  Smoking Status Never Smoker  Smokeless Tobacco Former NeurosurgeonUser  . Types: Chew    Social History   Substance and Sexual Activity  Alcohol Use Not Currently     No Known Allergies  Current Facility-Administered Medications  Medication Dose Route Frequency Provider Last Rate Last Dose  . acetaminophen (TYLENOL) tablet 650 mg  650 mg Oral Q4H PRN Laverda Pageoberts, Lindsay B, NP      . aspirin EC tablet 325 mg  325 mg Oral Daily Laverda Pageoberts, Lindsay B, NP   325 mg  at 04/30/18 1005  . furosemide (LASIX) injection 40 mg  40 mg Intravenous BID Laverda Pageoberts, Lindsay B, NP   40 mg at 04/29/18 1929  . glipiZIDE (GLUCOTROL XL) 24 hr tablet 10 mg  10 mg Oral BID Laverda Pageoberts, Lindsay B, NP   10 mg at 04/29/18 2129  . guaiFENesin-dextromethorphan (ROBITUSSIN DM) 100-10 MG/5ML syrup 5 mL  5 mL Oral Q4H PRN Nahser, Deloris PingPhilip J, MD   5 mL at 04/29/18 2227  . heparin injection 5,000 Units  5,000 Units Subcutaneous Q8H Laverda Pageoberts, Lindsay B, NP   5,000 Units at 04/30/18 0502  . insulin aspart (novoLOG) injection 0-5 Units  0-5 Units Subcutaneous QHS Means, Greig CastillaGregory Thomas, MD      . insulin aspart (novoLOG) injection 0-9 Units  0-9 Units Subcutaneous TID WC Means, Greig CastillaGregory Thomas, MD      . metoprolol tartrate (LOPRESSOR) tablet 25 mg  25 mg Oral BID Laverda Pageoberts, Lindsay B, NP   25 mg at 04/30/18 1005  . nitroGLYCERIN (NITROSTAT) SL tablet 0.4 mg  0.4 mg Sublingual Q5 Min x 3 PRN Laverda Pageoberts, Lindsay B, NP      . ondansetron [facility](ZOFRAN) injection 4 mg  4 mg Intravenous Q6H PRN Laverda Pageoberts, Lindsay B, NP   4 mg at 04/30/18 0540    Medications Prior to Admission  Medication Sig Dispense Refill Last Dose  . aspirin EC 325 MG tablet Take 1 tablet (325 mg total) by mouth daily. 90 tablet 3 04/28/2018 at 0900  . glipiZIDE (GLUCOTROL XL) 10 MG 24 hr tablet Take 10 mg by mouth 2 (two) times daily.   04/28/2018 at 2000  . metoprolol tartrate (LOPRESSOR) 25 MG tablet Take 1 tablet (25 mg total) by mouth 2 (two) times daily. 60 tablet 1 04/28/2018 at 2000  . pioglitazone (ACTOS) 30 MG tablet Take 30 mg by mouth every morning.    04/28/2018 at 0900  . sitaGLIPtin (JANUVIA) 100 MG tablet Take 100 mg by mouth every morning.    04/28/2018 at 0900    Family History  Problem Relation Age of Onset  . Diabetes Mother   . CAD Father      Review of Systems:   Review of Systems  Constitutional: Positive for malaise/fatigue. Negative for chills and fever.  HENT: Negative.   Respiratory: Positive for cough and  shortness of breath. Negative for sputum production and wheezing.   Cardiovascular: Positive for leg swelling. Negative for chest pain and palpitations.  Gastrointestinal: Positive for vomiting. Negative for nausea.  Neurological: Negative.   Psychiatric/Behavioral: Negative.    Pertinent items are noted in HPI.  Physical Exam: BP 127/88   Pulse 91   Temp 97.9 F (36.6 C) (Oral)   Resp 14   Ht 5' 6.5" (1.689 m)   Wt 61.4 kg (135 lb 5.8 oz)   SpO2 95%   BMI 21.52 kg/m    General appearance: alert, cooperative and no distress Resp: clear to auscultation bilaterally Cardio: regular rate and rhythm, S1, S2 normal, no murmur, click, rub or gallop GI: soft, non-tender; bowel sounds normal; no masses,  no organomegaly Extremities: extremities normal, atraumatic, no cyanosis or edema Neurologic: Grossly normal  Diagnostic Studies & Laboratory data:  04/29/2018 Echocardiogram  Result status: Final result                              *[place]*                   *[facility]*                         [address]                        [place], [place] [address]                            [phone]  ------------------------------------------------------------------- Transthoracic Echocardiography  Patient:    Rolland, Steinert MR #:       [id number] Study Date: 04/29/2018 Gender:     M Age:        79 Height:     168.9 cm Weight:     63.5 kg BSA:        1.73 m^2 Pt. Status: Room:   PERFORMING   [facility], Inpatient  ADMITTING    Melene Plan  ATTENDING    Janean Sark  SONOGRAPHER  Dance, Tiffany  cc:  ------------------------------------------------------------------- LV EF: 55% -   60%  ------------------------------------------------------------------- Indications:      CHF - 428.0.  ------------------------------------------------------------------- History:   PMH:  Chronic Kidney  Disease.  Risk factors: Hypertension. Diabetes mellitus.  ------------------------------------------------------------------- Study Conclusions  - Left ventricle: The cavity size was normal. Wall thickness was   normal. Systolic function was normal. The estimated ejection   fraction was in the range of 55% to 60%. - Mitral valve: There was mild regurgitation. - Pulmonary arteries: PA peak pressure: 36 mm Hg (S). - Pericardium, extracardiac: Large pleural effusion. Large   pericardial effusion, seen best in apical views. Largest   collection around RA. Maximal 18 mm Possibly larger.   There is RA indentation. but no RV collapse. Mitral inflow is   without significant respiratory variation. IVC collapses normally   with inspiration. Overall does not meet criteria for tamponade by   echo criteria There was a left pleural effusion.        Recent Radiology Findings:   Dg Chest 2 View  Result Date: 04/29/2018 CLINICAL DATA:  Shortness of breath. EXAM: CHEST - 2 VIEW COMPARISON:  Radiographs of April 12, 2018. FINDINGS: Stable cardiomediastinal silhouette. Status post coronary artery bypass graft. No pneumothorax is noted. Interval development of mild bilateral pleural effusions with associated subsegmental atelectasis. Bony thorax is unremarkable. IMPRESSION: Interval development of mild bilateral pleural effusions with associated subsegmental atelectasis. Electronically Signed   By: Fayrene Fearing  Christen Butter, M.D.   On: 04/29/2018 12:54     I have independently reviewed the above radiologic studies and discussed with the patient   Recent Lab Findings: Lab Results  Component Value Date   WBC 7.2 04/30/2018   HGB 10.7 (L) 04/30/2018   HCT 34.8 (L) 04/30/2018   PLT 169 04/30/2018   GLUCOSE 149 (H) 04/30/2018   ALT 16 (L) 03/11/2018   AST 14 (L) 03/11/2018   NA 138 04/30/2018   K 5.3 (H) 04/30/2018   CL 109 04/30/2018   CREATININE 1.73 (H) 04/30/2018   BUN 27 (H) 04/30/2018   CO2 21 (L)  04/30/2018   INR 1.48 03/12/2018   HGBA1C 7.1 (H) 03/11/2018      Assessment / Plan:      1. Pericardial effusion-no cardiac tamponade symptoms. Await echocardiogram read from today. Treating with IV lasix. Stable HR and BP. Currently NPO. 2. Pleural effusion-mild bilateral pleural effusion on CXR. Lung fields sound clear on exam. 3. CKD-creatinine 1.73, BUN 27. Second dose of IV lasix held. Patient appears euvolemic on exam. -2L yesterday 4. Hypertension-Only on Lopressor 25mg  BID. Well controlled.  5. Anticoagulation-on full-dose ASA and Heparin subq   Plan: Continue medical treatment per cardiology. Will review Echocardiogram with attending. Explained subxiphoid pericardial window with the patient and wife at the bedside. All questions were answered to their satisfaction.    I  spent 30 minutes counseling the patient face to face.   Jari Favre, PA-C  04/30/2018 10:06 AM  Chronic Kidney Disease   Stage I     GFR >90  Stage II    GFR 60-89  Stage IIIA GFR 45-59  Stage IIIB GFR 30-44  Stage IV   GFR 15-29  Stage V    GFR  <15  Lab Results  Component Value Date   CREATININE 1.73 (H) 04/30/2018   Estimated Creatinine Clearance: 40.9 mL/min (A) (by C-G formula based on SCr of 1.73 mg/dL (H)). I have seen and examined [name] and agree with the above assessment  and plan.  [name] MD Beeper [phone] Office [phone] 04/30/2018 3:32 PM   Increasing renal insufficiency compared to recent admission- stage IIIa patient seen last pm and this am, diuresed , feels well but follow echo has evidence of early tamponade per cardiology Discussed with patient proceeding with subxiphoid drainage/window.  The goals risks and alternatives of the planned surgical procedure Procedure(s): SUBXYPHOID PERICARDIAL WINDOW (N/A) TRANSESOPHAGEAL ECHOCARDIOGRAM (TEE) (N/A)  have been discussed with the patient in detail. The risks of the procedure including death, infection,  stroke, myocardial infarction, bleeding, blood transfusion have all been discussed specifically.  I have quoted [name] a 1 % of perioperative mortality and a complication rate as high as 20 %. The patient's questions have been answered.Kahlel Peake is willing  to proceed with the planned procedure.

## 2018-04-30 NOTE — Plan of Care (Signed)
  Problem: Education: Goal: Knowledge of General Education information will improve Outcome: Progressing   Problem: Health Behavior/Discharge Planning: Goal: Ability to manage health-related needs will improve Outcome: Progressing   Problem: Clinical Measurements: Goal: Ability to maintain clinical measurements within normal limits will improve Outcome: Progressing Goal: Will remain free from infection Outcome: Progressing Goal: Diagnostic test results will improve Outcome: Progressing Goal: Respiratory complications will improve Outcome: Progressing Goal: Cardiovascular complication will be avoided Outcome: Progressing   Problem: Coping: Goal: Level of anxiety will decrease Outcome: Progressing   Problem: Elimination: Goal: Will not experience complications related to bowel motility Outcome: Progressing   Problem: Safety: Goal: Ability to remain free from injury will improve Outcome: Progressing

## 2018-04-30 NOTE — Progress Notes (Signed)
Verbal order per Dr Elease HashimotoNahser to hold AM Lasix until after AM Echo has resulted.

## 2018-04-30 NOTE — Brief Op Note (Addendum)
      [address]       [place] [id number]             [phone]      04/30/2018  10:46 AM  PATIENT:  Daniel Malone  57 y.o. male  PRE-OPERATIVE DIAGNOSIS:  LARGE PERICARDIAL EFFUSION  POST-OPERATIVE DIAGNOSIS:  Large Pericardial Effusion  PROCEDURE:  Procedure(s): SUBXYPHOID PERICARDIAL WINDOW WITH DRAINAGE OF PERICARDIAL EFFUSION (N/A) TRANSESOPHAGEAL ECHOCARDIOGRAM (TEE) (N/A)  SURGEON:  Surgeon(s) and Role:    * Delight OvensGerhardt, Cotton Beckley B, MD - Primary  PHYSICIAN ASSISTANT:  Jari Favreessa Conte, PA-C   ANESTHESIA:   general  EBL:  50 mL   BLOOD ADMINISTERED:none  DRAINS: ONE BLAKE PERICARDIAL DRAIN   LOCAL MEDICATIONS USED:  NONE  SPECIMEN:  Source of Specimen:  PERICARDIAL FLUID  DISPOSITION OF SPECIMEN:  PATHOLOGY  COUNTS:  YES  DICTATION: .Dragon Dictation  PLAN OF CARE: Admit to inpatient   PATIENT DISPOSITION:  ICU - extubated and stable.   Delay start of Pharmacological VTE agent (>24hrs) due to surgical blood loss or risk of bleeding: yes

## 2018-04-30 NOTE — Anesthesia Procedure Notes (Signed)
Procedure Name: Intubation Date/Time: 04/30/2018 3:59 PM Performed by: Rosiland OzMeyers, Chijioke Lasser, CRNA Pre-anesthesia Checklist: Patient identified, Emergency Drugs available, Suction available, Patient being monitored and Timeout performed Patient Re-evaluated:Patient Re-evaluated prior to induction Oxygen Delivery Method: Circle system utilized Preoxygenation: Pre-oxygenation with 100% oxygen Induction Type: IV induction Ventilation: Mask ventilation without difficulty Laryngoscope Size: Miller and 3 Grade View: Grade II Tube type: Oral Tube size: 7.5 mm Number of attempts: 2 Airway Equipment and Method: Stylet Placement Confirmation: ETT inserted through vocal cords under direct vision,  positive ETCO2 and breath sounds checked- equal and bilateral Secured at: 22 cm Tube secured with: Tape Dental Injury: Teeth and Oropharynx as per pre-operative assessment

## 2018-04-30 NOTE — Progress Notes (Signed)
  Echocardiogram Echocardiogram Transesophageal has been performed.  Belva ChimesWendy  Prisila Dlouhy 04/30/2018, 4:13 PM

## 2018-04-30 NOTE — Anesthesia Procedure Notes (Signed)
Arterial Line Insertion Start/End7/09/2018 3:00 PM, 04/30/2018 3:20 PM Performed by: Rosiland OzMeyers, Juanna Pudlo, CRNA, CRNA  Patient location: Pre-op. Preanesthetic checklist: patient identified, IV checked, site marked, risks and benefits discussed, surgical consent, monitors and equipment checked, pre-op evaluation, timeout performed and anesthesia consent Lidocaine 1% used for infiltration Left, radial was placed Catheter size: 20 G Hand hygiene performed , maximum sterile barriers used  and Seldinger technique used  Attempts: 3 Procedure performed without using ultrasound guided technique. Following insertion, Biopatch and dressing applied. Post procedure assessment: normal  Patient tolerated the procedure well with no immediate complications.

## 2018-04-30 NOTE — Progress Notes (Signed)
Progress Note  Patient Name: Daniel CarneyRandal Crehan Date of Encounter: 04/30/2018  Primary Cardiologist: Garwin Brothersajan R Revankar, MD   Subjective    57 yo male with PMH of CAD s/p CABG x4, HTN, HL, CKD who presented to the office for follow up and reported shortness of breath. Found to have both pericardial and pleural effusions. [facility]  Pt has done well overnight. Has diuresed 2.1 liters so far  Since last night   Inpatient Medications    Scheduled Meds: . aspirin EC  325 mg Oral Daily  . furosemide  40 mg Intravenous BID  . glipiZIDE  10 mg Oral BID  . heparin  5,000 Units Subcutaneous Q8H  . insulin aspart  0-5 Units Subcutaneous QHS  . insulin aspart  0-9 Units Subcutaneous TID WC  . metoprolol tartrate  25 mg Oral BID   Continuous Infusions:  PRN Meds: acetaminophen, guaiFENesin-dextromethorphan, nitroGLYCERIN, ondansetron (ZOFRAN) IV   Vital Signs    Vitals:   04/30/18 0545 04/30/18 0600 04/30/18 0605 04/30/18 0630  BP: (!) 129/91 (!) 113/100 (!) 131/96 110/85  Pulse: 95 97 94 91  Resp: 18 (!) 24 17 15   Temp:      TempSrc:      SpO2: 100% 97% 99% 97%  Weight:      Height:        Intake/Output Summary (Last 24 hours) at 04/30/2018 0650 Last data filed at 04/30/2018 0540 Gross per 24 hour  Intake -  Output 2150 ml  Net -2150 ml   Filed Weights   04/29/18 1238 04/30/18 0500  Weight: 140 lb (63.5 kg) 135 lb 5.8 oz (61.4 kg)    Telemetry    NSR - Personally Reviewed  ECG      - Personally Reviewed  Physical Exam   GEN: No acute distress.   Neck: No JVD Cardiac: RRR, no murmurs, rubs, or gallops.  Respiratory: reduced breath sounds left base.  GI: Soft, nontender, non-distended  MS: No edema; No deformity. Neuro:  Nonfocal  Psych: Normal affect   Labs    Chemistry Recent Labs  Lab 04/29/18 1300 04/29/18 2118 04/30/18 0229  NA 134*  --  138  K 5.0  --  5.3*  CL 107  --  109  CO2 19*  --  21*  GLUCOSE 161*  --  149*  BUN 34*  --   27*  CREATININE 1.62* 1.77* 1.73*  CALCIUM 8.8*  --  9.1  GFRNONAA 46* 41* 42*  GFRAA 53* 47* 49*  ANIONGAP 8  --  8     Hematology Recent Labs  Lab 04/29/18 1300 04/29/18 2118 04/30/18 0229  WBC 8.3 8.1 7.2  RBC 3.86* 3.55* 3.53*  HGB 12.1* 11.0* 10.7*  HCT 37.2* 34.9* 34.8*  MCV 96.4 98.3 98.6  MCH 31.3 31.0 30.3  MCHC 32.5 31.5 30.7  RDW 14.4 14.1 14.2  PLT 183 175 169    Cardiac Enzymes Recent Labs  Lab 04/29/18 1300  TROPONINI <0.03   No results for input(s): TROPIPOC in the last 168 hours.   BNP Recent Labs  Lab 04/29/18 1300  BNP 271.1*     DDimer No results for input(s): DDIMER in the last 168 hours.   Radiology    Dg Chest 2 View  Result Date: 04/29/2018 CLINICAL DATA:  Shortness of breath. EXAM: CHEST - 2 VIEW COMPARISON:  Radiographs of April 12, 2018. FINDINGS: Stable cardiomediastinal silhouette. Status post coronary artery bypass graft. No pneumothorax is  noted. Interval development of mild bilateral pleural effusions with associated subsegmental atelectasis. Bony thorax is unremarkable. IMPRESSION: Interval development of mild bilateral pleural effusions with associated subsegmental atelectasis. Electronically Signed   By: Lupita Raider, M.D.   On: 04/29/2018 12:54    Cardiac Studies      Patient Profile     57 y.o. male with recent CABG admitted with increased dyspnea and a large pericardial effusion  Assessment & Plan    1.   Pericardial effusion:   Large by echo yesterday . No tamponade physiology by echo yesterdayu Has diuresed 2 liters and is feeling better.  Repeat echo today. Will keep NPO until he has been seen by Dr. Tyrone Sage.   2. CAD :  No angina  3. Pleural effusion.   Continue lasix   For questions or updates, please contact [facility] Please consult [email or web address] for contact info under Cardiology/STEMI.      Signed, Kristeen Miss, MD  04/30/2018, 6:50 AM

## 2018-04-30 NOTE — Anesthesia Preprocedure Evaluation (Signed)
Anesthesia Evaluation  Patient identified by MRN, date of birth, ID band Patient awake    Reviewed: Allergy & Precautions, NPO status , Patient's Chart, lab work & pertinent test results  Airway Mallampati: II  TM Distance: >3 FB Neck ROM: Full    Dental  (+) Teeth Intact, Dental Advisory Given   Pulmonary    breath sounds clear to auscultation       Cardiovascular hypertension, Pt. on medications + CAD, + CABG, + Peripheral Vascular Disease and + DOE   Rhythm:Regular Rate:Normal     Neuro/Psych  Neuromuscular disease CVA    GI/Hepatic   Endo/Other  diabetes  Renal/GU Renal disease     Musculoskeletal   Abdominal   Peds  Hematology   Anesthesia Other Findings   Reproductive/Obstetrics                             Anesthesia Physical  Anesthesia Plan  ASA: IV and emergent  Anesthesia Plan: General   Post-op Pain Management:    Induction: Intravenous  PONV Risk Score and Plan: Ondansetron and Treatment may vary due to age or medical condition  Airway Management Planned: Oral ETT  Additional Equipment: Arterial line  Intra-op Plan:   Post-operative Plan: Possible Post-op intubation/ventilation  Informed Consent: I have reviewed the patients History and Physical, chart, labs and discussed the procedure including the risks, benefits and alternatives for the proposed anesthesia with the patient or authorized representative who has indicated his/her understanding and acceptance.   Dental advisory given  Plan Discussed with: CRNA  Anesthesia Plan Comments:         Anesthesia Quick Evaluation                                  Anesthesia Evaluation  Patient identified by MRN, date of birth, ID band Patient awake    Reviewed: Allergy & Precautions, NPO status , Patient's Chart, lab work & pertinent test results  Airway Mallampati: II  TM Distance: >3 FB Neck ROM:  Full    Dental  (+) Teeth Intact, Dental Advisory Given   Pulmonary    breath sounds clear to auscultation       Cardiovascular hypertension,  Rhythm:Regular Rate:Normal     Neuro/Psych    GI/Hepatic   Endo/Other  diabetes  Renal/GU      Musculoskeletal   Abdominal   Peds  Hematology   Anesthesia Other Findings   Reproductive/Obstetrics                             Anesthesia Physical Anesthesia Plan  ASA: III  Anesthesia Plan: General   Post-op Pain Management:    Induction: Intravenous  PONV Risk Score and Plan:   Airway Management Planned: Oral ETT  Additional Equipment: Arterial line, PA Cath, 3D TEE and Ultrasound Guidance Line Placement  Intra-op Plan:   Post-operative Plan: Post-operative intubation/ventilation  Informed Consent: I have reviewed the patients History and Physical, chart, labs and discussed the procedure including the risks, benefits and alternatives for the proposed anesthesia with the patient or authorized representative who has indicated his/her understanding and acceptance.   Dental advisory given  Plan Discussed with: CRNA and Anesthesiologist  Anesthesia Plan Comments:         Anesthesia Quick Evaluation

## 2018-04-30 NOTE — Plan of Care (Signed)
  Problem: Clinical Measurements: Goal: Ability to maintain clinical measurements within normal limits will improve Outcome: Progressing Goal: Respiratory complications will improve Outcome: Progressing   Problem: Activity: Goal: Risk for activity intolerance will decrease Outcome: Progressing   Problem: Coping: Goal: Level of anxiety will decrease Outcome: Progressing   Problem: Elimination: Goal: Will not experience complications related to urinary retention Outcome: Progressing   Problem: Pain Managment: Goal: General experience of comfort will improve Outcome: Progressing   Problem: Safety: Goal: Ability to remain free from injury will improve Outcome: Progressing   Problem: Skin Integrity: Goal: Risk for impaired skin integrity will decrease Outcome: Progressing

## 2018-04-30 NOTE — Progress Notes (Signed)
Patient ID: Lupe CarneyRandal Malone, male   DOB: Jun 23, 1961, 57 y.o.   MRN: [id number] EVENING ROUNDS NOTE :     [address]       [place] [id number]             [phone]                 Day of Surgery Procedure(s) (LRB): SUBXYPHOID PERICARDIAL WINDOW WITH DRAINAGE OF PERICARDIAL EFFUSION (N/A) TRANSESOPHAGEAL ECHOCARDIOGRAM (TEE) (N/A)  Total Length of Stay:  LOS: 1 day  BP 128/81   Pulse 90   Temp (!) 97.3 F (36.3 C)   Resp 18   Ht 5' 6.5" (1.689 m)   Wt 135 lb 5.8 oz (61.4 kg)   SpO2 91%   BMI 21.52 kg/m   .Intake/Output      07/11 0701 - 07/12 0700 07/12 0701 - 07/13 0700   I.V. (mL/kg)  500 (8.1)   Total Intake(mL/kg)  500 (8.1)   Urine (mL/kg/hr) 2150 100 (0.1)   Blood  50   Chest Tube  20   Total Output 2150 170   Net -2150 +330          .  ceFAZolin (ANCEF) IV    . dextrose 5 % and 0.45% NaCl    . potassium chloride       Lab Results  Component Value Date   WBC 7.2 04/30/2018   HGB 10.7 (L) 04/30/2018   HCT 34.8 (L) 04/30/2018   PLT 169 04/30/2018   GLUCOSE 149 (H) 04/30/2018   ALT 16 (L) 03/11/2018   AST 14 (L) 03/11/2018   NA 138 04/30/2018   K 5.3 (H) 04/30/2018   CL 109 04/30/2018   CREATININE 1.73 (H) 04/30/2018   BUN 27 (H) 04/30/2018   CO2 21 (L) 04/30/2018   INR 1.11 04/30/2018   HGBA1C 7.1 (H) 03/11/2018    Stable post op No drainage now from pericardial tube  Delight OvensEdward B Mazie Fencl MD  Beeper [phone] Office [phone] 04/30/2018 6:51 PM

## 2018-04-30 NOTE — Transfer of Care (Signed)
Immediate Anesthesia Transfer of Care Note  Patient: Daniel Malone  Procedure(s) Performed: SUBXYPHOID PERICARDIAL WINDOW WITH DRAINAGE OF PERICARDIAL EFFUSION (N/A Chest) TRANSESOPHAGEAL ECHOCARDIOGRAM (TEE) (N/A )  Patient Location: PACU  Anesthesia Type:General  Level of Consciousness: awake and patient cooperative  Airway & Oxygen Therapy: Patient Spontanous Breathing and Patient connected to face mask oxygen  Post-op Assessment: Report given to RN and Post -op Vital signs reviewed and stable  Post vital signs: Reviewed and stable  Last Vitals:  Vitals Value Taken Time  BP 116/76 04/30/2018  5:17 PM  Temp 36.5 C 04/30/2018  5:17 PM  Pulse 90 04/30/2018  5:18 PM  Resp 13 04/30/2018  5:18 PM  SpO2 100 % 04/30/2018  5:18 PM  Vitals shown include unvalidated device data.  Last Pain:  Vitals:   04/30/18 1717  TempSrc:   PainSc: Asleep         Complications: No apparent anesthesia complications

## 2018-05-01 ENCOUNTER — Inpatient Hospital Stay (HOSPITAL_COMMUNITY): Payer: Self-pay

## 2018-05-01 ENCOUNTER — Other Ambulatory Visit (HOSPITAL_COMMUNITY): Payer: Self-pay

## 2018-05-01 DIAGNOSIS — Z951 Presence of aortocoronary bypass graft: Secondary | ICD-10-CM

## 2018-05-01 LAB — GLUCOSE, BODY FLUID OTHER: Glucose, Body Fluid Other: 107 mg/dL

## 2018-05-01 LAB — BASIC METABOLIC PANEL
Anion gap: 10 (ref 5–15)
BUN: 31 mg/dL — AB (ref 6–20)
CALCIUM: 8.6 mg/dL — AB (ref 8.9–10.3)
CHLORIDE: 106 mmol/L (ref 98–111)
CO2: 17 mmol/L — AB (ref 22–32)
CREATININE: 1.81 mg/dL — AB (ref 0.61–1.24)
GFR calc Af Amer: 46 mL/min — ABNORMAL LOW (ref >60)
GFR calc non Af Amer: 40 mL/min — ABNORMAL LOW (ref >60)
GLUCOSE: 217 mg/dL — AB (ref 70–99)
Potassium: 5.5 mmol/L — ABNORMAL HIGH (ref 3.5–5.1)
Sodium: 133 mmol/L — ABNORMAL LOW (ref 135–145)

## 2018-05-01 LAB — PROTEIN, BODY FLUID (OTHER): Total Protein, Body Fluid Other: 5.1 g/dL

## 2018-05-01 LAB — POCT I-STAT 3, ART BLOOD GAS (G3+)
Acid-base deficit: 9 mmol/L — ABNORMAL HIGH (ref 0.0–2.0)
BICARBONATE: 15.3 mmol/L — AB (ref 20.0–28.0)
O2 SAT: 95 %
PCO2 ART: 28.6 mmHg — AB (ref 32.0–48.0)
PO2 ART: 76 mmHg — AB (ref 83.0–108.0)
TCO2: 16 mmol/L — AB (ref 22–32)
pH, Arterial: 7.336 — ABNORMAL LOW (ref 7.350–7.450)

## 2018-05-01 LAB — ACID FAST SMEAR (AFB, MYCOBACTERIA): Acid Fast Smear: NEGATIVE

## 2018-05-01 LAB — GLUCOSE, CAPILLARY
GLUCOSE-CAPILLARY: 232 mg/dL — AB (ref 70–99)
GLUCOSE-CAPILLARY: 111 mg/dL — AB (ref 70–99)
Glucose-Capillary: 193 mg/dL — ABNORMAL HIGH (ref 70–99)
Glucose-Capillary: 167 mg/dL — ABNORMAL HIGH (ref 70–99)
Glucose-Capillary: 134 mg/dL — ABNORMAL HIGH (ref 70–99)

## 2018-05-01 LAB — CBC
HEMATOCRIT: 34.6 % — AB (ref 39.0–52.0)
Hemoglobin: 10.8 g/dL — ABNORMAL LOW (ref 13.0–17.0)
MCH: 30.2 pg (ref 26.0–34.0)
MCHC: 31.2 g/dL (ref 30.0–36.0)
MCV: 96.6 fL (ref 78.0–100.0)
Platelets: 176 10*3/uL (ref 150–400)
RBC: 3.58 MIL/uL — ABNORMAL LOW (ref 4.22–5.81)
RDW: 13.2 % (ref 11.5–15.5)
WBC: 8.5 10*3/uL (ref 4.0–10.5)

## 2018-05-01 LAB — LD, BODY FLUID (OTHER): LD, Body Fluid: 190 IU/L

## 2018-05-01 MED ORDER — INSULIN ASPART 100 UNIT/ML ~~LOC~~ SOLN
0.0000 [IU] | Freq: Three times a day (TID) | SUBCUTANEOUS | Status: DC
  Administered 2018-05-01: 2 [IU] via SUBCUTANEOUS
  Administered 2018-05-02: 4 [IU] via SUBCUTANEOUS
  Administered 2018-05-02: 8 [IU] via SUBCUTANEOUS
  Administered 2018-05-03: 4 [IU] via SUBCUTANEOUS

## 2018-05-01 MED ORDER — ENOXAPARIN SODIUM 30 MG/0.3ML ~~LOC~~ SOLN
30.0000 mg | Freq: Every day | SUBCUTANEOUS | Status: DC
  Administered 2018-05-01 – 2018-05-03 (×3): 30 mg via SUBCUTANEOUS
  Filled 2018-05-01 (×3): qty 0.3

## 2018-05-01 MED ORDER — TRAMADOL HCL 50 MG PO TABS: 50.0000 mg | ORAL_TABLET | Freq: Four times a day (QID) | ORAL | Status: DC | PRN

## 2018-05-01 MED ORDER — FUROSEMIDE 10 MG/ML IJ SOLN
20.0000 mg | Freq: Once | INTRAMUSCULAR | Status: AC
  Administered 2018-05-01: 20 mg via INTRAVENOUS
  Filled 2018-05-01: qty 2

## 2018-05-01 NOTE — Progress Notes (Signed)
Progress Note  Patient Name: Daniel Malone Date of Encounter: 05/01/2018  Primary Cardiologist: Garwin Brothers, MD   Subjective   57 yo male with PMH of CAD s/p CABG x4, HTN, HL, CKD who presented to the office for follow up and reported shortness of breath. Found to have both pericardial and pleural effusions. Sent to [facility] from [facility].  Postop day #1 subxiphoid pericardial window performed by Dr. Tyrone Sage.  Drainage signs are still in place.  The patient feels clinically improved.  Vital signs are stable.    Inpatient Medications    Scheduled Meds: . acetaminophen  1,000 mg Oral Q6H   Or  . acetaminophen (TYLENOL) oral liquid 160 mg/5 mL  1,000 mg Oral Q6H  . bisacodyl  10 mg Oral Daily  . enoxaparin (LOVENOX) injection  30 mg Subcutaneous Daily  . insulin aspart  0-24 Units Subcutaneous Q4H  . senna-docusate  1 tablet Oral QHS   Continuous Infusions: . dextrose 5 % and 0.45% NaCl 50 mL/hr at 05/01/18 0800   PRN Meds: fentaNYL (SUBLIMAZE) injection, ondansetron (ZOFRAN) IV, oxyCODONE, traMADol   Vital Signs    Vitals:   05/01/18 0700 05/01/18 0722 05/01/18 0800 05/01/18 0900  BP: 107/74  131/82 102/72  Pulse: 94  (!) 104 97  Resp: 14  (!) 21 18  Temp:  98.3 F (36.8 C)    TempSrc:  Oral    SpO2: 96%  95% 92%  Weight:      Height:        Intake/Output Summary (Last 24 hours) at 05/01/2018 0941 Last data filed at 05/01/2018 0800 Gross per 24 hour  Intake 1302.24 ml  Output 1030 ml  Net 272.24 ml   Filed Weights   04/29/18 1238 04/30/18 0500  Weight: 140 lb (63.5 kg) 135 lb 5.8 oz (61.4 kg)    Telemetry    Sinus rhythm- Personally Reviewed  ECG    Not performed today- Personally Reviewed  Physical Exam   GEN: No acute distress.   Neck: No JVD Cardiac: RRR, no murmurs, rubs, or gallops.  Respiratory: Clear to auscultation bilaterally. GI: Soft, nontender, non-distended  MS: No edema; No deformity. Neuro:  Nonfocal  Psych: Normal affect   Labs      Chemistry Recent Labs  Lab 04/29/18 1300 04/29/18 2118 04/30/18 0229 05/01/18 0404  NA 134*  --  138 133*  K 5.0  --  5.3* 5.5*  CL 107  --  109 106  CO2 19*  --  21* 17*  GLUCOSE 161*  --  149* 217*  BUN 34*  --  27* 31*  CREATININE 1.62* 1.77* 1.73* 1.81*  CALCIUM 8.8*  --  9.1 8.6*  GFRNONAA 46* 41* 42* 40*  GFRAA 53* 47* 49* 46*  ANIONGAP 8  --  8 10     Hematology Recent Labs  Lab 04/29/18 2118 04/30/18 0229 05/01/18 0404  WBC 8.1 7.2 8.5  RBC 3.55* 3.53* 3.58*  HGB 11.0* 10.7* 10.8*  HCT 34.9* 34.8* 34.6*  MCV 98.3 98.6 96.6  MCH 31.0 30.3 30.2  MCHC 31.5 30.7 31.2  RDW 14.1 14.2 13.2  PLT 175 169 176    Cardiac Enzymes Recent Labs  Lab 04/29/18 1300  TROPONINI <0.03   No results for input(s): TROPIPOC in the last 168 hours.   BNP Recent Labs  Lab 04/29/18 1300  BNP 271.1*     DDimer No results for input(s): DDIMER in the last 168 hours.   Radiology  Dg Chest 2 View  Result Date: 04/29/2018 CLINICAL DATA:  Shortness of breath. EXAM: CHEST - 2 VIEW COMPARISON:  Radiographs of April 12, 2018. FINDINGS: Stable cardiomediastinal silhouette. Status post coronary artery bypass graft. No pneumothorax is noted. Interval development of mild bilateral pleural effusions with associated subsegmental atelectasis. Bony thorax is unremarkable. IMPRESSION: Interval development of mild bilateral pleural effusions with associated subsegmental atelectasis. Electronically Signed   By: Lupita RaiderJames  Green Jr, M.D.   On: 04/29/2018 12:54   Dg Chest Port 1 View  Result Date: 05/01/2018 CLINICAL DATA:  Status post pericardial window creation EXAM: PORTABLE CHEST 1 VIEW COMPARISON:  04/30/2018 FINDINGS: Postop CABG.  Heart size normal. Pericardial drain is seen overlying the lower cardiac silhouette unchanged. Bibasilar atelectasis and effusion with interval improvement. Improved vascular congestion compared with the prior study. IMPRESSION: Pericardial drain remains in  place. Improved aeration in the lung bases. Improved vascular congestion. Electronically Signed   By: Marlan Palauharles  Clark M.D.   On: 05/01/2018 07:31   Dg Chest Port 1 View  Result Date: 04/30/2018 CLINICAL DATA:  Status post pericardial window. EXAM: PORTABLE CHEST 1 VIEW COMPARISON:  04/29/2018 and older exams. FINDINGS: Cardiac silhouette is normal in size and configuration. No mediastinal widening. No mediastinal or hilar masses. There is opacity in the lower lungs bilaterally obscuring hemidiaphragms consistent with moderate bilateral pleural effusions, which appear larger than they did on the previous day's study. No convincing pneumonia. No evidence of pulmonary edema. No pneumothorax. Stable changes from prior CABG surgery. IMPRESSION: 1. Bilateral pleural effusions increased in size when compared to the prior exam. 2. No convincing pneumonia or evidence of pulmonary edema. No pneumothorax. 3. Cardiac silhouette normal in size and configuration. Electronically Signed   By: Amie Portlandavid  Ormond M.D.   On: 04/30/2018 17:33    Cardiac Studies   2D echocardiogram (04/30/2018)  Study Conclusions  - Pericardium, extracardiac: Large pericardial effusion surrounds   heart. Most prominent around inferior surface of right ventricle   and around RA There is RA and RV compression From subcostal view   measures 46 mm. From apical 2 chamber view measures 28 mm.   Evidence of filling compromise.   Patient Profile     57 y.o. male history of bypass grafting x4, hypertension, hyperlipidemia and chronic kidney disease who was found to have a large pericardial effusion with pre-tamponade physiology.  He underwent subxiphoid pericardial window by Dr. Tyrone SageGerhardt yesterday.  He feels clinically improved.  He still draining.  Dr. Tyrone SageGerhardt to decide when to remove the pericardial drain.  Assessment & Plan    1: Coronary artery disease- history of CAD status post CABG 03/12/2018.  2: Chronic kidney disease- baseline  appears to be 1.3-1.4.  Serum creatinine is 1.8 today.  Will need need to monitor closely.  He may be intravascular volume depleted.  3: Pericardial tamponade- postop day #1 subxiphoid pericardial window by Dr. Tyrone SageGerhardt.  Drain is still in place and draining.  Dr. Tyrone SageGerhardt to discontinue once drainage tapers.  We will recheck a 2D echo on Monday.  For questions or updates, please contact [facility] Please consult [email or web address] for contact info under Cardiology/STEMI.      Signed, Nanetta BattyJonathan Elana Jian, MD  05/01/2018, 9:41 AM

## 2018-05-01 NOTE — Anesthesia Postprocedure Evaluation (Signed)
Anesthesia Post Note  Patient: Diplomatic Services operational officerandal Skluzacek  Procedure(s) Performed: SUBXYPHOID PERICARDIAL WINDOW WITH DRAINAGE OF PERICARDIAL EFFUSION (N/A Chest) TRANSESOPHAGEAL ECHOCARDIOGRAM (TEE) (N/A )     Patient location during evaluation: PACU Anesthesia Type: General Level of consciousness: awake Pain management: pain level controlled Vital Signs Assessment: post-procedure vital signs reviewed and stable Respiratory status: spontaneous breathing, nonlabored ventilation, respiratory function stable and patient connected to nasal cannula oxygen Cardiovascular status: blood pressure returned to baseline and stable Postop Assessment: no apparent nausea or vomiting Anesthetic complications: no    Last Vitals:  Vitals:   05/01/18 0600 05/01/18 0700  BP: 121/78 107/74  Pulse: 95 94  Resp: 13 14  Temp:    SpO2: 94% 96%    Last Pain:  Vitals:   05/01/18 0336  TempSrc: Oral  PainSc:                  Eddrick Dilone P Rance Smithson

## 2018-05-01 NOTE — Progress Notes (Signed)
Patient ID: Lupe CarneyRandal Gregg, male   DOB: [birth year], 57 y.o.   MRN: [id number] EVENING ROUNDS NOTE :     [address]       [place] [id number]             [phone]                 1 Day Post-Op Procedure(s) (LRB): SUBXYPHOID PERICARDIAL WINDOW WITH DRAINAGE OF PERICARDIAL EFFUSION (N/A) TRANSESOPHAGEAL ECHOCARDIOGRAM (TEE) (N/A)  Total Length of Stay:  LOS: 2 days  BP (!) 147/97   Pulse 100   Temp 98 F (36.7 C) (Oral)   Resp 16   Ht 5' 6.5" (1.689 m)   Wt 135 lb 5.8 oz (61.4 kg)   SpO2 97%   BMI 21.52 kg/m   .Intake/Output      07/13 0701 - 07/14 0700   I.V. (mL/kg) 437.6 (7.1)   IV Piggyback    Total Intake(mL/kg) 437.6 (7.1)   Urine (mL/kg/hr) 1645 (2.1)   Blood    Chest Tube 70   Total Output 1715   Net -1277.4         . dextrose 5 % and 0.45% NaCl Stopped (05/01/18 1602)     Lab Results  Component Value Date   WBC 8.5 05/01/2018   HGB 10.8 (L) 05/01/2018   HCT 34.6 (L) 05/01/2018   PLT 176 05/01/2018   GLUCOSE 217 (H) 05/01/2018   ALT 16 (L) 03/11/2018   AST 14 (L) 03/11/2018   NA 133 (L) 05/01/2018   K 5.5 (H) 05/01/2018   CL 106 05/01/2018   CREATININE 1.81 (H) 05/01/2018   BUN 31 (H) 05/01/2018   CO2 17 (L) 05/01/2018   INR 1.11 04/30/2018   HGBA1C 7.1 (H) 03/11/2018   Stable day Poss remove darain in am Follow up k and cr in am    Delight OvensEdward B Daryl Quiros MD  Beeper [phone] Office [phone] 05/01/2018 7:36 PM

## 2018-05-01 NOTE — Progress Notes (Signed)
Patient ID: Daniel CarneyRandal Heizer, male   DOB: Sep 21, 1961, 57 y.o.   MRN: [id number] TCTS DAILY ICU PROGRESS NOTE                   [address]            [place] [id number]          [phone]   1 Day Post-Op Procedure(s) (LRB): SUBXYPHOID PERICARDIAL WINDOW WITH DRAINAGE OF PERICARDIAL EFFUSION (N/A) TRANSESOPHAGEAL ECHOCARDIOGRAM (TEE) (N/A)  Total Length of Stay:  LOS: 2 days   Subjective: Patient awake alert neurologically intact no specific complaints this morning  Objective: Vital signs in last 24 hours: Temp:  [97.3 F (36.3 C)-97.9 F (36.6 C)] 97.7 F (36.5 C) (07/13 0336) Pulse Rate:  [85-100] 94 (07/13 0700) Cardiac Rhythm: Normal sinus rhythm (07/12 2000) Resp:  [9-21] 14 (07/13 0700) BP: (99-144)/(71-95) 107/74 (07/13 0700) SpO2:  [90 %-100 %] 96 % (07/13 0700) Arterial Line BP: (111-159)/(53-78) 130/56 (07/13 0700)  Filed Weights   04/29/18 1238 04/30/18 0500  Weight: 140 lb (63.5 kg) 135 lb 5.8 oz (61.4 kg)    Weight change:    Hemodynamic parameters for last 24 hours:    Intake/Output from previous day: 07/12 0701 - 07/13 0700 In: 1202.3 [I.V.:1002.3; IV Piggyback:200] Out: 960 [Urine:820; Blood:50; Chest Tube:90]  Intake/Output this shift: No intake/output data recorded.  Current Meds: Scheduled Meds: . acetaminophen  1,000 mg Oral Q6H   Or  . acetaminophen (TYLENOL) oral liquid 160 mg/5 mL  1,000 mg Oral Q6H  . bisacodyl  10 mg Oral Daily  . enoxaparin (LOVENOX) injection  40 mg Subcutaneous Daily  . insulin aspart  0-24 Units Subcutaneous Q4H  . senna-docusate  1 tablet Oral QHS   Continuous Infusions: . dextrose 5 % and 0.45% NaCl 50 mL/hr at 05/01/18 0600  . potassium chloride     PRN Meds:.fentaNYL (SUBLIMAZE) injection, ondansetron (ZOFRAN) IV, oxyCODONE, potassium chloride, traMADol  General appearance: alert, cooperative and no distress Neurologic: intact Heart: regular rate and rhythm, S1, S2 normal, no murmur,  click, rub or gallop Lungs: diminished breath sounds bibasilar Abdomen: soft, non-tender; bowel sounds normal; no masses,  no organomegaly Wound: No air leak minimal drainage from pericardial tube  Lab Results: CBC: Recent Labs    04/30/18 0229 05/01/18 0404  WBC 7.2 8.5  HGB 10.7* 10.8*  HCT 34.8* 34.6*  PLT 169 176   BMET:  Recent Labs    04/30/18 0229 05/01/18 0404  NA 138 133*  K 5.3* 5.5*  CL 109 106  CO2 21* 17*  GLUCOSE 149* 217*  BUN 27* 31*  CREATININE 1.73* 1.81*  CALCIUM 9.1 8.6*    CMET: Lab Results  Component Value Date   WBC 8.5 05/01/2018   HGB 10.8 (L) 05/01/2018   HCT 34.6 (L) 05/01/2018   PLT 176 05/01/2018   GLUCOSE 217 (H) 05/01/2018   ALT 16 (L) 03/11/2018   AST 14 (L) 03/11/2018   NA 133 (L) 05/01/2018   K 5.5 (H) 05/01/2018   CL 106 05/01/2018   CREATININE 1.81 (H) 05/01/2018   BUN 31 (H) 05/01/2018   CO2 17 (L) 05/01/2018   INR 1.11 04/30/2018   HGBA1C 7.1 (H) 03/11/2018      PT/INR:  Recent Labs    04/30/18 1155  LABPROT 14.2  INR 1.11   Radiology: Dg Chest Port 1 View  Result Date: 04/30/2018 CLINICAL DATA:  Status post pericardial window. EXAM: PORTABLE CHEST 1  VIEW COMPARISON:  04/29/2018 and older exams. FINDINGS: Cardiac silhouette is normal in size and configuration. No mediastinal widening. No mediastinal or hilar masses. There is opacity in the lower lungs bilaterally obscuring hemidiaphragms consistent with moderate bilateral pleural effusions, which appear larger than they did on the previous day's study. No convincing pneumonia. No evidence of pulmonary edema. No pneumothorax. Stable changes from prior CABG surgery. IMPRESSION: 1. Bilateral pleural effusions increased in size when compared to the prior exam. 2. No convincing pneumonia or evidence of pulmonary edema. No pneumothorax. 3. Cardiac silhouette normal in size and configuration. Electronically Signed   By: Amie Portland M.D.   On: 04/30/2018 17:33   Chronic  Kidney Disease   Stage I     GFR >90  Stage II    GFR 60-89  Stage IIIA GFR 45-59  Stage IIIB GFR 30-44  Stage IV   GFR 15-29  Stage V    GFR  <15  Lab Results  Component Value Date   CREATININE 1.81 (H) 05/01/2018   Estimated Creatinine Clearance: 39.1 mL/min (A) (by C-G formula based on SCr of 1.81 mg/dL (H)).   Assessment/Plan: S/P Procedure(s) (LRB): SUBXYPHOID PERICARDIAL WINDOW WITH DRAINAGE OF PERICARDIAL EFFUSION (N/A) TRANSESOPHAGEAL ECHOCARDIOGRAM (TEE) (N/A) Mobilize Diuresis d/c tubes/lines Monitor potassium Stage IIIb chronic kidney disease-  Pericardial tube off suction, monitor output today and DC'd later today or tomorrow  [name] 05/01/2018 7:14 AM

## 2018-05-02 ENCOUNTER — Inpatient Hospital Stay (HOSPITAL_COMMUNITY): Payer: Self-pay

## 2018-05-02 LAB — CBC
HEMATOCRIT: 35.7 % — AB (ref 39.0–52.0)
Hemoglobin: 11.5 g/dL — ABNORMAL LOW (ref 13.0–17.0)
MCH: 30.3 pg (ref 26.0–34.0)
MCHC: 32.2 g/dL (ref 30.0–36.0)
MCV: 94.2 fL (ref 78.0–100.0)
Platelets: 183 10*3/uL (ref 150–400)
RBC: 3.79 MIL/uL — AB (ref 4.22–5.81)
RDW: 13.3 % (ref 11.5–15.5)
WBC: 8.9 10*3/uL (ref 4.0–10.5)

## 2018-05-02 LAB — COMPREHENSIVE METABOLIC PANEL
ALBUMIN: 2.9 g/dL — AB (ref 3.5–5.0)
ALT: 14 U/L (ref 0–44)
AST: 17 U/L (ref 15–41)
Alkaline Phosphatase: 63 U/L (ref 38–126)
Anion gap: 9 (ref 5–15)
BILIRUBIN TOTAL: 0.8 mg/dL (ref 0.3–1.2)
BUN: 24 mg/dL — ABNORMAL HIGH (ref 6–20)
CO2: 20 mmol/L — ABNORMAL LOW (ref 22–32)
CREATININE: 1.52 mg/dL — AB (ref 0.61–1.24)
Calcium: 8.7 mg/dL — ABNORMAL LOW (ref 8.9–10.3)
Chloride: 107 mmol/L (ref 98–111)
GFR calc Af Amer: 57 mL/min — ABNORMAL LOW (ref >60)
GFR calc non Af Amer: 49 mL/min — ABNORMAL LOW (ref >60)
Glucose, Bld: 100 mg/dL — ABNORMAL HIGH (ref 70–99)
Potassium: 4.5 mmol/L (ref 3.5–5.1)
Sodium: 136 mmol/L (ref 135–145)
Total Protein: 5.4 g/dL — ABNORMAL LOW (ref 6.5–8.1)

## 2018-05-02 LAB — GLUCOSE, CAPILLARY
GLUCOSE-CAPILLARY: 118 mg/dL — AB (ref 70–99)
GLUCOSE-CAPILLARY: 157 mg/dL — AB (ref 70–99)
Glucose-Capillary: 162 mg/dL — ABNORMAL HIGH (ref 70–99)
Glucose-Capillary: 250 mg/dL — ABNORMAL HIGH (ref 70–99)
Glucose-Capillary: 62 mg/dL — ABNORMAL LOW (ref 70–99)

## 2018-05-02 NOTE — Progress Notes (Signed)
Progress Note  Patient Name: Daniel Malone Date of Encounter: 05/02/2018  Primary Cardiologist: Garwin Brothers, MD   Subjective   57 yo male with PMH of CAD s/p CABG x4, HTN, HL, CKD who presented to the office for follow up and reported shortness of breath. Found to have both pericardial and pleural effusions. Sent to [facility] from [facility].  Postop day #1 subxiphoid pericardial window performed by Dr. Tyrone Sage.  Drainage signs are still in place.  The patient feels clinically improved.  Vital signs are stable.    Inpatient Medications    Scheduled Meds: . acetaminophen  1,000 mg Oral Q6H   Or  . acetaminophen (TYLENOL) oral liquid 160 mg/5 mL  1,000 mg Oral Q6H  . bisacodyl  10 mg Oral Daily  . enoxaparin (LOVENOX) injection  30 mg Subcutaneous Daily  . insulin aspart  0-24 Units Subcutaneous TID AC & HS  . senna-docusate  1 tablet Oral QHS   Continuous Infusions: . dextrose 5 % and 0.45% NaCl Stopped (05/01/18 1602)   PRN Meds: fentaNYL (SUBLIMAZE) injection, ondansetron (ZOFRAN) IV, oxyCODONE, traMADol   Vital Signs    Vitals:   05/02/18 0500 05/02/18 0600 05/02/18 0700 05/02/18 0742  BP: 131/82 125/89 (!) 137/94   Pulse: (!) 104 (!) 106 99   Resp: 11 12 13    Temp:    97.9 F (36.6 C)  TempSrc:    Oral  SpO2: 96% 95% 96%   Weight:      Height:        Intake/Output Summary (Last 24 hours) at 05/02/2018 0801 Last data filed at 05/02/2018 0600 Gross per 24 hour  Intake 387.66 ml  Output 2617 ml  Net -2229.34 ml   Filed Weights   04/29/18 1238 04/30/18 0500  Weight: 140 lb (63.5 kg) 135 lb 5.8 oz (61.4 kg)    Telemetry    Sinus rhythm- Personally Reviewed  ECG    Not performed today- Personally Reviewed  Physical Exam   GEN: No acute distress.   Neck: No JVD Cardiac: RRR, no murmurs, rubs, or gallops.  Respiratory: Clear to auscultation bilaterally. GI: Soft, nontender, non-distended  MS: No edema; No deformity. Neuro:  Nonfocal  Psych: Normal  affect   Labs    Chemistry Recent Labs  Lab 04/30/18 0229 05/01/18 0404 05/02/18 0226  NA 138 133* 136  K 5.3* 5.5* 4.5  CL 109 106 107  CO2 21* 17* 20*  GLUCOSE 149* 217* 100*  BUN 27* 31* 24*  CREATININE 1.73* 1.81* 1.52*  CALCIUM 9.1 8.6* 8.7*  PROT  --   --  5.4*  ALBUMIN  --   --  2.9*  AST  --   --  17  ALT  --   --  14  ALKPHOS  --   --  63  BILITOT  --   --  0.8  GFRNONAA 42* 40* 49*  GFRAA 49* 46* 57*  ANIONGAP 8 10 9      Hematology Recent Labs  Lab 04/30/18 0229 05/01/18 0404 05/02/18 0226  WBC 7.2 8.5 8.9  RBC 3.53* 3.58* 3.79*  HGB 10.7* 10.8* 11.5*  HCT 34.8* 34.6* 35.7*  MCV 98.6 96.6 94.2  MCH 30.3 30.2 30.3  MCHC 30.7 31.2 32.2  RDW 14.2 13.2 13.3  PLT 169 176 183    Cardiac Enzymes Recent Labs  Lab 04/29/18 1300  TROPONINI <0.03   No results for input(s): TROPIPOC in the last 168 hours.   BNP Recent Labs  Lab 04/29/18 1300  BNP 271.1*     DDimer No results for input(s): DDIMER in the last 168 hours.   Radiology    Dg Chest Port 1 View  Result Date: 05/01/2018 CLINICAL DATA:  Status post pericardial window creation EXAM: PORTABLE CHEST 1 VIEW COMPARISON:  04/30/2018 FINDINGS: Postop CABG.  Heart size normal. Pericardial drain is seen overlying the lower cardiac silhouette unchanged. Bibasilar atelectasis and effusion with interval improvement. Improved vascular congestion compared with the prior study. IMPRESSION: Pericardial drain remains in place. Improved aeration in the lung bases. Improved vascular congestion. Electronically Signed   By: Marlan Palauharles  Clark M.D.   On: 05/01/2018 07:31   Dg Chest Port 1 View  Result Date: 04/30/2018 CLINICAL DATA:  Status post pericardial window. EXAM: PORTABLE CHEST 1 VIEW COMPARISON:  04/29/2018 and older exams. FINDINGS: Cardiac silhouette is normal in size and configuration. No mediastinal widening. No mediastinal or hilar masses. There is opacity in the lower lungs bilaterally obscuring  hemidiaphragms consistent with moderate bilateral pleural effusions, which appear larger than they did on the previous day's study. No convincing pneumonia. No evidence of pulmonary edema. No pneumothorax. Stable changes from prior CABG surgery. IMPRESSION: 1. Bilateral pleural effusions increased in size when compared to the prior exam. 2. No convincing pneumonia or evidence of pulmonary edema. No pneumothorax. 3. Cardiac silhouette normal in size and configuration. Electronically Signed   By: Amie Portlandavid  Ormond M.D.   On: 04/30/2018 17:33    Cardiac Studies   2D echocardiogram (04/30/2018)  Study Conclusions  - Pericardium, extracardiac: Large pericardial effusion surrounds   heart. Most prominent around inferior surface of right ventricle   and around RA There is RA and RV compression From subcostal view   measures 46 mm. From apical 2 chamber view measures 28 mm.   Evidence of filling compromise.   Patient Profile     57 y.o. male history of bypass grafting x4, hypertension, hyperlipidemia and chronic kidney disease who was found to have a large pericardial effusion with pre-tamponade physiology.  He underwent subxiphoid pericardial window by Dr. Tyrone SageGerhardt yesterday.  He feels clinically improved.  He still draining.  Dr. Tyrone SageGerhardt to decide when to remove the pericardial drain.  Assessment & Plan    1: Coronary artery disease- history of CAD status post CABG 03/12/2018.  2: Chronic kidney disease- baseline appears to be 1.3-1.4.  Serum creatinine is improved today from 1.8 to 1.52.  .  Will need need to monitor closely.  He may be intravascular volume depleted.  3: Pericardial tamponade- postop day #2 subxiphoid pericardial window by Dr. Tyrone SageGerhardt.  Drain is still in place and draining.  Dr. Tyrone SageGerhardt has seen today and is going to discontinue the pericardial drainage tube.  We will recheck a 2D echo on Monday.  Patient is stable for transfer to telemetry.  For questions or updates, please  contact [facility] Please consult [email or web address] for contact info under Cardiology/STEMI.      Signed, Nanetta BattyJonathan Rigby Leonhardt, MD  05/02/2018, 8:01 AM

## 2018-05-02 NOTE — Progress Notes (Signed)
Pt received from 2H. VSS. Chg complete. Telemetry applied. Pt and family oriented to room and unit. Call light in reach. Will continue to monitor.  Versie StarksHanna  Atasha Colebank, RN

## 2018-05-02 NOTE — Progress Notes (Signed)
Patient ID: Daniel CarneyRandal Dado, male   DOB: 09/19/1961, 57 y.o.   MRN: [id number] TCTS DAILY ICU PROGRESS NOTE                   [address]            [place] [id number]          [phone]   2 Days Post-Op Procedure(s) (LRB): SUBXYPHOID PERICARDIAL WINDOW WITH DRAINAGE OF PERICARDIAL EFFUSION (N/A) TRANSESOPHAGEAL ECHOCARDIOGRAM (TEE) (N/A)  Total Length of Stay:  LOS: 3 days   Subjective: Patient feels better,   Objective: Vital signs in last 24 hours: Temp:  [97.9 F (36.6 C)-98.5 F (36.9 C)] 97.9 F (36.6 C) (07/14 0742) Pulse Rate:  [91-113] 99 (07/14 0700) Cardiac Rhythm: Sinus tachycardia (07/13 2000) Resp:  [9-21] 13 (07/14 0700) BP: (94-161)/(71-98) 137/94 (07/14 0700) SpO2:  [92 %-100 %] 96 % (07/14 0700) Arterial Line BP: (117-150)/(51-75) 117/51 (07/13 0900)  Filed Weights   04/29/18 1238 04/30/18 0500  Weight: 140 lb (63.5 kg) 135 lb 5.8 oz (61.4 kg)    Weight change:    Hemodynamic parameters for last 24 hours:    Intake/Output from previous day: 07/13 0701 - 07/14 0700 In: 437.6 [I.V.:437.6] Out: 2687 [Urine:2570; Chest Tube:117]  Intake/Output this shift: No intake/output data recorded.  Current Meds: Scheduled Meds: . acetaminophen  1,000 mg Oral Q6H   Or  . acetaminophen (TYLENOL) oral liquid 160 mg/5 mL  1,000 mg Oral Q6H  . bisacodyl  10 mg Oral Daily  . enoxaparin (LOVENOX) injection  30 mg Subcutaneous Daily  . insulin aspart  0-24 Units Subcutaneous TID AC & HS  . senna-docusate  1 tablet Oral QHS   Continuous Infusions: . dextrose 5 % and 0.45% NaCl Stopped (05/01/18 1602)   PRN Meds:.fentaNYL (SUBLIMAZE) injection, ondansetron (ZOFRAN) IV, oxyCODONE, traMADol  General appearance: alert and cooperative Neurologic: intact Heart: regular rate and rhythm, S1, S2 normal, no murmur, click, rub or gallop Lungs: clear to auscultation bilaterally Abdomen: soft, non-tender; bowel sounds normal; no masses,  no  organomegaly Extremities: extremities normal, atraumatic, no cyanosis or edema and Homans sign is negative, no sign of DVT Wound: Decreasing drainage from pericardial tube  Lab Results: CBC: Recent Labs    05/01/18 0404 05/02/18 0226  WBC 8.5 8.9  HGB 10.8* 11.5*  HCT 34.6* 35.7*  PLT 176 183   BMET:  Recent Labs    05/01/18 0404 05/02/18 0226  NA 133* 136  K 5.5* 4.5  CL 106 107  CO2 17* 20*  GLUCOSE 217* 100*  BUN 31* 24*  CREATININE 1.81* 1.52*  CALCIUM 8.6* 8.7*    CMET: Lab Results  Component Value Date   WBC 8.9 05/02/2018   HGB 11.5 (L) 05/02/2018   HCT 35.7 (L) 05/02/2018   PLT 183 05/02/2018   GLUCOSE 100 (H) 05/02/2018   ALT 14 05/02/2018   AST 17 05/02/2018   NA 136 05/02/2018   K 4.5 05/02/2018   CL 107 05/02/2018   CREATININE 1.52 (H) 05/02/2018   BUN 24 (H) 05/02/2018   CO2 20 (L) 05/02/2018   INR 1.11 04/30/2018   HGBA1C 7.1 (H) 03/11/2018      PT/INR:  Recent Labs    04/30/18 1155  LABPROT 14.2  INR 1.11   Radiology: No results found.   Assessment/Plan: S/P Procedure(s) (LRB): SUBXYPHOID PERICARDIAL WINDOW WITH DRAINAGE OF PERICARDIAL EFFUSION (N/A) TRANSESOPHAGEAL ECHOCARDIOGRAM (TEE) (N/A) Plan for transfer to step-down Possible home  tomorrow Follow-up PA lateral chest x-ray in a.m. Renal function improved, close to baseline Good urine output   [name] 05/02/2018 7:46 AM

## 2018-05-03 ENCOUNTER — Inpatient Hospital Stay (HOSPITAL_COMMUNITY): Payer: Self-pay

## 2018-05-03 ENCOUNTER — Encounter (HOSPITAL_COMMUNITY): Payer: Self-pay | Admitting: Cardiothoracic Surgery

## 2018-05-03 ENCOUNTER — Other Ambulatory Visit: Payer: Self-pay

## 2018-05-03 DIAGNOSIS — I313 Pericardial effusion (noninflammatory): Secondary | ICD-10-CM

## 2018-05-03 LAB — BASIC METABOLIC PANEL
ANION GAP: 7 (ref 5–15)
BUN: 17 mg/dL (ref 6–20)
CO2: 24 mmol/L (ref 22–32)
Calcium: 8.7 mg/dL — ABNORMAL LOW (ref 8.9–10.3)
Chloride: 106 mmol/L (ref 98–111)
Creatinine, Ser: 1.36 mg/dL — ABNORMAL HIGH (ref 0.61–1.24)
GFR calc Af Amer: >60 mL/min (ref >60)
GFR, EST NON AFRICAN AMERICAN: 56 mL/min — AB (ref >60)
GLUCOSE: 122 mg/dL — AB (ref 70–99)
POTASSIUM: 4.8 mmol/L (ref 3.5–5.1)
Sodium: 137 mmol/L (ref 135–145)

## 2018-05-03 LAB — GLUCOSE, CAPILLARY
GLUCOSE-CAPILLARY: 120 mg/dL — AB (ref 70–99)
GLUCOSE-CAPILLARY: 191 mg/dL — AB (ref 70–99)

## 2018-05-03 LAB — ECHOCARDIOGRAM COMPLETE
HEIGHTINCHES: 66.496 in
WEIGHTICAEL: 2028.8 [oz_av]

## 2018-05-03 LAB — PATHOLOGIST SMEAR REVIEW: Path Review: INCREASED

## 2018-05-03 MED ORDER — METOPROLOL TARTRATE 25 MG PO TABS: 12.5000 mg | ORAL_TABLET | Freq: Two times a day (BID) | ORAL | 4 refills | Status: AC

## 2018-05-03 MED ORDER — METOPROLOL TARTRATE 12.5 MG HALF TABLET
12.5000 mg | ORAL_TABLET | Freq: Two times a day (BID) | ORAL | Status: DC
  Administered 2018-05-03: 12.5 mg via ORAL
  Filled 2018-05-03: qty 1

## 2018-05-03 MED ORDER — ASPIRIN 81 MG PO TBEC: 81.0000 mg | DELAYED_RELEASE_TABLET | Freq: Every day | ORAL | Status: AC

## 2018-05-03 MED ORDER — ASPIRIN EC 81 MG PO TBEC
81.0000 mg | DELAYED_RELEASE_TABLET | Freq: Every day | ORAL | Status: DC
  Administered 2018-05-03: 81 mg via ORAL
  Filled 2018-05-03: qty 1

## 2018-05-03 NOTE — Progress Notes (Signed)
  Echocardiogram 2D Echocardiogram has been performed.  Pieter PartridgeBrooke S Kalmen Lollar 05/03/2018, 9:17 AM

## 2018-05-03 NOTE — Discharge Instructions (Signed)
Discharge Instructions:  1. You may shower, please wash incisions daily with soap and water and keep dry.  If you wish to cover wounds with dressing you may do so but please keep clean and change daily.  No tub baths or swimming until incisions have completely healed.  If your incisions become red or develop any drainage please call our office at [phone]  2. No Driving until cleared by TCTS office and you are no longer using narcotic pain medications  3. Monitor your weight daily.. Please use the same scale and weigh at same time... If you gain 5-10 lbs in 48 hours with associated lower extremity swelling, please contact our office at [phone]  4. Fever of 101.5 for at least 24 hours with no source, please contact our office at [phone]  5. Activity- up as tolerated, please walk at least 3 times per day.  Avoid strenuous activity.  6. If any questions or concerns arise, please do not hesitate to contact our office at [phone]

## 2018-05-03 NOTE — Discharge Summary (Signed)
Discharge Summary    Patient ID: Daniel Malone,  MRN: [id number], DOB/AGE: 01-06-1961 57 y.o.  Admit date: 04/29/2018 Discharge date: 05/03/2018  Primary Care Provider: Nonnie Done Primary Cardiologist: Garwin Brothers, MD  Discharge Diagnoses    Principal Problem:   Pericardial effusion Active Problems:   Essential hypertension   Diabetes mellitus due to underlying condition with unspecified complications (HCC)   Chronic renal disease, stage 3, moderately decreased glomerular filtration rate (GFR) between 30-59 mL/min/1.73 square meter (HCC)   S/P CABG x 4  Allergies No Known Allergies  Diagnostic Studies/Procedures    Echocardiogram 04/29/2018:  Study Conclusions  - Left ventricle: The cavity size was normal. Wall thickness was normal. Systolic function was normal. The estimated ejection fraction was in the range of 55% to 60%. - Mitral valve: There was mild regurgitation. - Pulmonary arteries: PA peak pressure: 36 mm Hg (S). - Pericardium, extracardiac: Large pleural effusion. Large pericardial effusion, seen best in apical views. Largest collection around RA. Maximal 18 mm Possibly larger. There is RA indentation. but no RV collapse. Mitral inflow is without significant respiratory variation. IVC collapses normally with inspiration. Overall does not meet criteria for tamponade by echo criteria There was a left pleural effusion.  History of Present Illness    57 yo male with PMH of CAD s/p CABG x4, HTN, HL, CKD who presented to the office for follow up and reported shortness of breath. Found to have both pericardial and pleural effusions. Sent to [facility] from [facility] on 04/29/18.   Daniel Malone is a 57 year old male with a history stated above who is followed by Dr. Tomie China as an outpatient.  He was seen in the office on 03/09/2018 with reports of exertional chest pain.  Given his symptoms and his risk factors he was referred for an outpatient  cardiac catheterization.  He underwent a cardiac cath on 03/10/2018 which showed multivessel disease and he was referred for surgical consult for possible CABG.  He was seen by Dr. Tyrone Sage and felt to be a suitable candidate.  He was taken to the operating room on 03/12/2018 and underwent four-vessel bypass with a LIMA to the LAD, SVG to diagonal, SVG to intermediate ramus and SVG to distal circumflex.  He remained hemodynamically stable post surgery.  He did have some postoperative renal insufficiency, but did improve towards the time of discharge.  He was seen back in the office on 6/10 and reported doing well.  Approximately 1 week prior to admission, he reported developing shortness of breath with orthopnea and lower extremity edema.  Symptoms were reported as worsening over a 1 week time.,  As he was unable to go from room to room without feeling extremely short of breath.  He presented to the cardiology office on 04/29/2018 for follow-up and reported the symptoms.  He was then taken to [facility] ED and an informal echocardiogram was performed showing concern for pericardial effusion with possible tamponade.  The case was then discussed with TCTS.  He was then transported to [facility] for further evaluation.  Hospital Course   In the ED at [facility], his labs showed Na+ 134, Cr 1.6, Hb 12.1, BNP 271, troponin negative.  EKG just showed NSR with nonspecific T wave changes.  He was noted to be hypertensive with systolic blood pressures in the 150-160 range.  A limited echocardiogram was performed in the emergency department which reported a large pleural effusion and large pericardial effusion with RA indention, but no  RV collapse.  There was no tamponade noted.  Given the above, TCTS was consulted for subxiphoid pericardial window with drainage of pericardial effusion performed on 04/30/2018 clinical improvement in symptoms.  Additionally, he was started on IV Lasix 40 mg twice daily for pleural effusion.  Plans  were to recheck echocardiogram today, 05/03/2018 which showed trivial pericardial effusion (L pleural effusion present) day of discharge. CXR shows small bilateral pleural effusions L>R.  Renal function improving.  He was started on metoprolol 12.5 twice daily for mild elevation in heart rate.  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   Other hospital problems include:  1.  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  CKD stage III: -Creatinine, 1.36 today, down from 1.52 yesterday -Baseline appears to be in the 1.3-1.4 range   3.  DM2: -Stable, blood glucose 122 this AM -Will restart home antidiabetic medications -Will need to follow with PCP for further adjustments -Will check BMET at follow-up appointment to assess renal function  4.  Hypertension: -Stable, 139/98, 144/98, 141/87 -No anti-hypertensive medications  Consultants: TCTS  Patient has been seen and examined by Dr. Huston Foley who feels that he is stable and ready for discharge today, 05/03/2018. _____________  Discharge Vitals Blood pressure (!) 139/98, pulse 95, temperature (!) 97.5 F (36.4 C), resp. rate 12, height 5' 6.5" (1.689 m), weight 126 lb 12.8 oz (57.5 kg), SpO2 97 %.  Filed Weights   04/29/18 1238 04/30/18 0500 05/03/18 0543  Weight: 140 lb (63.5 kg) 135 lb 5.8 oz (61.4 kg) 126 lb 12.8 oz (57.5 kg)   Labs & Radiologic Studies    CBC Recent Labs    05/01/18 0404 05/02/18 0226  WBC 8.5 8.9  HGB 10.8* 11.5*  HCT 34.6* 35.7*  MCV 96.6 94.2  PLT 176 183   Basic Metabolic Panel Recent Labs    91/47/82 0226 05/03/18 0421  NA 136 137  K 4.5 4.8  CL 107 106  CO2 20* 24  GLUCOSE 100* 122*  BUN 24* 17  CREATININE 1.52* 1.36*  CALCIUM 8.7* 8.7*   Liver Function Tests Recent Labs    05/02/18 0226  AST 17  ALT 14  ALKPHOS 63  BILITOT 0.8  PROT 5.4*  ALBUMIN 2.9*    Dg Chest 2  View  Result Date: 05/03/2018 CLINICAL DATA:  Pleural effusions.  Pericardial effusion. EXAM: CHEST - 2 VIEW COMPARISON:  05/02/2018 and 04/29/2018 FINDINGS: The heart size and pulmonary vascularity are normal. Small bilateral pleural effusions, left greater than right, both diminished since the prior study. Pericardial drain has been removed. No infiltrates.  CABG. No bone abnormality. IMPRESSION: Diminished bilateral small pleural effusions. Cardiac silhouette is normal. Electronically Signed   By: Francene Boyers M.D.   On: 05/03/2018 07:06   Dg Chest 2 View  Result Date: 04/29/2018 CLINICAL DATA:  Shortness of breath. EXAM: CHEST - 2 VIEW COMPARISON:  Radiographs of April 12, 2018. FINDINGS: Stable cardiomediastinal silhouette. Status post coronary artery bypass graft. No pneumothorax is noted. Interval development of mild bilateral pleural effusions with associated subsegmental atelectasis. Bony thorax is unremarkable. IMPRESSION: Interval development of mild bilateral pleural effusions with associated subsegmental atelectasis. Electronically Signed   By: Lupita Raider, M.D.   On: 04/29/2018 12:54   Dg Chest 2 View  Result Date: 04/12/2018 CLINICAL DATA:  Status post CABG on Mar 12, 2018. No current chest  complaints. EXAM: CHEST - 2 VIEW COMPARISON:  PA and lateral chest x-ray of Mar 17, 2018 FINDINGS: The lungs are well-expanded and clear. The heart and pulmonary vascularity are normal. The mediastinum is normal in width. The sternal wires are intact. There is no pleural effusion or pneumothorax. The bony thorax is unremarkable. IMPRESSION: There is no residual pleural effusion nor other cardiopulmonary abnormality. Electronically Signed   By: David  Swaziland M.D.   On: 04/12/2018 14:07   Dg Chest Port 1 View  Result Date: 05/02/2018 CLINICAL DATA:  57 year old male with history of chest tube. EXAM: PORTABLE CHEST 1 VIEW COMPARISON:  Chest x-ray 05/01/2018. FINDINGS: Status post median sternotomy  for CABG. Surgical drain near the midline with tip projecting over the lower mediastinum, presumably a pericardial drain. Small bilateral pleural effusions. Bibasilar opacities, likely to reflect resolving postoperative subsegmental atelectasis. No evidence of pulmonary edema. Heart size is normal. Upper mediastinal contours are within normal limits. IMPRESSION: 1. Postoperative changes and support apparatus, as above. 2. Small bilateral pleural effusions with postoperative subsegmental atelectasis in the lung bases bilaterally. Electronically Signed   By: Trudie Reed M.D.   On: 05/02/2018 08:16   Dg Chest Port 1 View  Result Date: 05/01/2018 CLINICAL DATA:  Status post pericardial window creation EXAM: PORTABLE CHEST 1 VIEW COMPARISON:  04/30/2018 FINDINGS: Postop CABG.  Heart size normal. Pericardial drain is seen overlying the lower cardiac silhouette unchanged. Bibasilar atelectasis and effusion with interval improvement. Improved vascular congestion compared with the prior study. IMPRESSION: Pericardial drain remains in place. Improved aeration in the lung bases. Improved vascular congestion. Electronically Signed   By: Marlan Palau M.D.   On: 05/01/2018 07:31   Dg Chest Port 1 View  Result Date: 04/30/2018 CLINICAL DATA:  Status post pericardial window. EXAM: PORTABLE CHEST 1 VIEW COMPARISON:  04/29/2018 and older exams. FINDINGS: Cardiac silhouette is normal in size and configuration. No mediastinal widening. No mediastinal or hilar masses. There is opacity in the lower lungs bilaterally obscuring hemidiaphragms consistent with moderate bilateral pleural effusions, which appear larger than they did on the previous day's study. No convincing pneumonia. No evidence of pulmonary edema. No pneumothorax. Stable changes from prior CABG surgery. IMPRESSION: 1. Bilateral pleural effusions increased in size when compared to the prior exam. 2. No convincing pneumonia or evidence of pulmonary edema. No  pneumothorax. 3. Cardiac silhouette normal in size and configuration. Electronically Signed   By: Amie Portland M.D.   On: 04/30/2018 17:33   Disposition   Pt is being discharged home today in good condition.  Follow-up Plans & Appointments    Follow-up Information    [name], MD. Go on 05/13/2018.   Specialty:  Cardiothoracic Surgery Why:  PA/LAT CXR to be taken (at [facility] which is in the same building as Dr. Dennie Maizes office) on 05/13/2018 at 9:00 amAppointment is at 9:30 am Contact information: [address] [place] [address] [phone]        Revankar, Aundra Dubin, MD Follow up on 05/13/2018.   Specialty:  Cardiology Why:  Follow-up appointment will be on 05/13/2018 at 3:20 PM Dr. Tomie China at the [facility] location.  Please arrive to your appointment at 3 PM. Contact information: [address] [place] [address] [phone]          Discharge Instructions    Call MD for:  difficulty breathing, headache or visual disturbances   Complete by:  As directed    Call MD for:  persistant dizziness or light-headedness   Complete by:  As directed    Call MD for:  persistant nausea and vomiting   Complete by:  As directed    Call MD for:  redness, tenderness, or signs of infection (pain, swelling, redness, odor or green/yellow discharge around incision site)   Complete by:  As directed    Call MD for:  severe uncontrolled pain   Complete by:  As directed    Call MD for:  temperature >100.4   Complete by:  As directed    Diet - low sodium heart healthy   Complete by:  As directed    Discharge instructions   Complete by:  As directed    Can take Tylenol as directed for surgical pain.   Increase activity slowly   Complete by:  As directed      Discharge Medications   Allergies as of 05/03/2018   No Known Allergies     Medication List    TAKE these medications   aspirin 81 MG EC tablet Take 1 tablet (81 mg total)  by mouth daily. Start taking on:  05/04/2018 What changed:    medication strength  how much to take   glipiZIDE 10 MG 24 hr tablet Commonly known as:  GLUCOTROL XL Take 10 mg by mouth 2 (two) times daily.   metoprolol tartrate 25 MG tablet Commonly known as:  LOPRESSOR Take 0.5 tablets (12.5 mg total) by mouth 2 (two) times daily. What changed:  how much to take   pioglitazone 30 MG tablet Commonly known as:  ACTOS Take 30 mg by mouth every morning.   sitaGLIPtin 100 MG tablet Commonly known as:  JANUVIA Take 100 mg by mouth every morning.        Acute coronary syndrome (MI, NSTEMI, STEMI, etc) this admission?: No.    Outstanding Labs/Studies   Pt will need BMET at follow up appointment to re-assess renal function  Duration of Discharge Encounter   Greater than 30 minutes including physician time.  Signed, Georgie ChardJill Telly Jawad, NP 05/03/2018, 12:56 PM

## 2018-05-03 NOTE — Progress Notes (Addendum)
      [address]       [place] [id number]             [phone]        3 Days Post-Op Procedure(s) (LRB): SUBXYPHOID PERICARDIAL WINDOW WITH DRAINAGE OF PERICARDIAL EFFUSION (N/A) TRANSESOPHAGEAL ECHOCARDIOGRAM (TEE) (N/A)  Subjective: Patient without specific complaints this am.  Objective: Vital signs in last 24 hours: Temp:  [97.9 F (36.6 C)-98.5 F (36.9 C)] 98.5 F (36.9 C) (07/15 0543) Pulse Rate:  [95-106] 95 (07/14 0945) Cardiac Rhythm: Sinus tachycardia (07/14 1920) Resp:  [12-20] 12 (07/15 0543) BP: (140-150)/(87-99) 144/98 (07/15 0543) SpO2:  [96 %-99 %] 97 % (07/15 0543) Weight:  [126 lb 12.8 oz (57.5 kg)] 126 lb 12.8 oz (57.5 kg) (07/15 0543)  Current Weight  05/03/18 126 lb 12.8 oz (57.5 kg)       Intake/Output from previous day: 07/14 0701 - 07/15 0700 In: 480 [P.O.:480] Out: 2125 [Urine:2125]   Physical Exam:  Cardiovascular: Slightly tachycardic Pulmonary: Clear to auscultation on the right and slightly diminished left base Abdomen: Soft, non tender, bowel sounds present. Extremities: No lower extremity edema. Wound: Clean and dry.  Lab Results: CBC: Recent Labs    05/01/18 0404 05/02/18 0226  WBC 8.5 8.9  HGB 10.8* 11.5*  HCT 34.6* 35.7*  PLT 176 183   BMET:  Recent Labs    05/02/18 0226 05/03/18 0421  NA 136 137  K 4.5 4.8  CL 107 106  CO2 20* 24  GLUCOSE 100* 122*  BUN 24* 17  CREATININE 1.52* 1.36*  CALCIUM 8.7* 8.7*    PT/INR:  Lab Results  Component Value Date   INR 1.11 04/30/2018   INR 1.48 03/12/2018   INR 0.98 03/11/2018   ABG:  INR: Will add last result for INR, ABG once components are confirmed Will add last 4 CBG results once components are confirmed  Assessment/Plan:  1. CV - ST at times in the low 100's. Will restart low dose Lopressor 12.5 mg bid. Echo to be done today. 2.  Pulmonary - On room air. CXR this am shows small bilateral pleural effusions L>R. 3. Creatinine  decreased to 1.36. It was 1.62 upon this admission. 4.  Acute blood loss anemia - H and H 11.5 and 35.7 5. DM-CBGs 62/157/120. Will likely restart Glipizide XL, Actos and Januvia at discharge, if creatinine remains decreased.  Donielle M ZimmermanPA-C 05/03/2018,7:15 AM [phone]   Home soon Chest xray - clearing pleural effusions Renal function improving I have seen and examined Daniel Malone and agree with the above assessment  and plan.  Delight OvensEdward B Telesforo Brosnahan MD Beeper [phone] Office [phone] 05/03/2018 11:16 AM

## 2018-05-03 NOTE — Op Note (Signed)
NAMLupe Malone: Nichter, Jethro MEDICAL RECORD [id number] ACCOUNT [id number] DATE OF BIRTH:03-Jul-1961 FACILITY: [facility] LOCATION: [facility] PHYSICIAN:Quanasia Defino Bari EdwardB. Gilberta Peeters, MD  OPERATIVE REPORT  DATE OF PROCEDURE:  04/29/2018  PREOPERATIVE DIAGNOSIS:  Large pericardial effusion.  POSTOPERATIVE DIAGNOSIS:  Large pericardial effusion.  SURGICAL PROCEDURE:  Subxiphoid pericardial window with drainage of pericardial effusion.  SURGEON:  Sheliah PlaneEdward Shlome Baldree, MD  FIRST ASSISTANT:  Boris Lownessa Conti, PA  BRIEF HISTORY:  The patient is a 57 year old male who 6 weeks before had undergone coronary artery bypass grafting.  He had made excellent progress postoperatively and was seen back in the office with a clear chest x-ray.  However, over 4 to 5 days prior  to readmission he had increasing shortness of breath and was seen in the cardiology office where a quick echocardiogram indicated a moderate to large pericardial effusion.  [facility] by surgery and after examining him and a  followup echocardiogram, he continued to have a large pericardial effusion with some evidence of early tamponade.  He has known renal insufficiency, but his creatinine level had increased up to 1.8 from discharge at 1.3-1.4.  I discussed the risks and  options with him and he was agreeable with proceeding with subxiphoid pericardial window and drainage of the pericardial effusion and signed informed consent.  DESCRIPTION OF PROCEDURE:  The patient underwent general endotracheal anesthesia without incident.  He remained hemodynamically stable.  Dr. Carlynn HeraldGermenoff placed a transesophageal echo probe and this nicely demonstrated a moderate to large pericardial effusion,  mostly posteriorly based.  Overall, ventricular function was good.  The skin of the chest was prepped with Betadine, draped in sterile manner after appropriate timeout was performed.  We then proceeded with a small incision over the subxiphoid process,  carried  down through the fascia.  The sternum that could be seen was well healed as well as his incision.  We elevated the sternum and came to the pericardial sac which was opened and approximately 300-350 mL of straw-colored pleural fluid was easily  drained by TEE.  This was completely drained.  A #28 Blake drain was then placed in the inferior pericardial space and brought out through a separate site just to the right of the incision and secured in place.  The incision itself was then closed with  interrupted 0 Vicryl, running 2-0 Vicryl, subcutaneous tissue with 3-0 subcuticular stitch in the skin edges.  Dermabond was applied.  The patient tolerated the procedure without obvious complication.  Sponge and needle count was reported as correct at  the completion of the procedure.  Estimated blood loss was minimal.  There were approximately 350 mL of straw-colored pericardial fluid drained and sent for cytology, cultures and cell count.  The patient was extubated in the operating room and  transferred to the recovery room for further postoperative observation.  TN/NUANCE  D:05/03/2018 T:05/03/2018 JOB:[id number]

## 2018-05-03 NOTE — Progress Notes (Signed)
Progress Note  Patient Name: Daniel CarneyRandal Faubert Date of Encounter: 05/03/2018  Primary Cardiologist: Garwin Brothersajan R Revankar, MD   Subjective   No CP  No dizziness  No SOB    Inpatient Medications    Scheduled Meds: . acetaminophen  1,000 mg Oral Q6H   Or  . acetaminophen (TYLENOL) oral liquid 160 mg/5 mL  1,000 mg Oral Q6H  . bisacodyl  10 mg Oral Daily  . enoxaparin (LOVENOX) injection  30 mg Subcutaneous Daily  . insulin aspart  0-24 Units Subcutaneous TID AC & HS  . metoprolol tartrate  12.5 mg Oral BID  . senna-docusate  1 tablet Oral QHS   Continuous Infusions:  PRN Meds: fentaNYL (SUBLIMAZE) injection, ondansetron (ZOFRAN) IV, oxyCODONE, traMADol   Vital Signs    Vitals:   05/02/18 1633 05/02/18 1958 05/03/18 0543 05/03/18 0733  BP: (!) 150/95 (!) 141/87 (!) 144/98 (!) 139/98  Pulse:      Resp: 14 17 12    Temp: 98.4 F (36.9 C) 98.5 F (36.9 C) 98.5 F (36.9 C) (!) 97.5 F (36.4 C)  TempSrc: Oral Oral Oral   SpO2: 99% 97% 97%   Weight:   57.5 kg (126 lb 12.8 oz)   Height:        Intake/Output Summary (Last 24 hours) at 05/03/2018 1126 Last data filed at 05/03/2018 [id number] Gross per 24 hour  Intake 480 ml  Output 1625 ml  Net -1145 ml   Filed Weights   04/29/18 1238 04/30/18 0500 05/03/18 0543  Weight: 63.5 kg (140 lb) 61.4 kg (135 lb 5.8 oz) 57.5 kg (126 lb 12.8 oz)    Telemetry    SR/ST   100s highest  Personally Reviewed  ECG      Physical Exam   GEN: No acute distress.   Neck: No JVD Cardiac: RRR, no murmurs, rubs, or gallops.  Respiratory: Clear to auscultation bilaterally. GI: Soft, nontender, non-distended  MS: No edema; No deformity. Neuro:  Nonfocal  Psych: Normal affect   Labs    Chemistry Recent Labs  Lab 05/01/18 0404 05/02/18 0226 05/03/18 0421  NA 133* 136 137  K 5.5* 4.5 4.8  CL 106 107 106  CO2 17* 20* 24  GLUCOSE 217* 100* 122*  BUN 31* 24* 17  CREATININE 1.81* 1.52* 1.36*  CALCIUM 8.6* 8.7* 8.7*  PROT  --  5.4*  --    ALBUMIN  --  2.9*  --   AST  --  17  --   ALT  --  14  --   ALKPHOS  --  63  --   BILITOT  --  0.8  --   GFRNONAA 40* 49* 56*  GFRAA 46* 57* >60  ANIONGAP 10 9 7      Hematology Recent Labs  Lab 04/30/18 0229 05/01/18 0404 05/02/18 0226  WBC 7.2 8.5 8.9  RBC 3.53* 3.58* 3.79*  HGB 10.7* 10.8* 11.5*  HCT 34.8* 34.6* 35.7*  MCV 98.6 96.6 94.2  MCH 30.3 30.2 30.3  MCHC 30.7 31.2 32.2  RDW 14.2 13.2 13.3  PLT 169 176 183    Cardiac Enzymes Recent Labs  Lab 04/29/18 1300  TROPONINI <0.03   No results for input(s): TROPIPOC in the last 168 hours.   BNP Recent Labs  Lab 04/29/18 1300  BNP 271.1*     DDimer No results for input(s): DDIMER in the last 168 hours.   Radiology    Dg Chest 2 View  Result Date: 05/03/2018 CLINICAL DATA:  Pleural effusions.  Pericardial effusion. EXAM: CHEST - 2 VIEW COMPARISON:  05/02/2018 and 04/29/2018 FINDINGS: The heart size and pulmonary vascularity are normal. Small bilateral pleural effusions, left greater than right, both diminished since the prior study. Pericardial drain has been removed. No infiltrates.  CABG. No bone abnormality. IMPRESSION: Diminished bilateral small pleural effusions. Cardiac silhouette is normal. Electronically Signed   By: Francene Boyers M.D.   On: 05/03/2018 07:06   Dg Chest Port 1 View  Result Date: 05/02/2018 CLINICAL DATA:  57 year old male with history of chest tube. EXAM: PORTABLE CHEST 1 VIEW COMPARISON:  Chest x-ray 05/01/2018. FINDINGS: Status post median sternotomy for CABG. Surgical drain near the midline with tip projecting over the lower mediastinum, presumably a pericardial drain. Small bilateral pleural effusions. Bibasilar opacities, likely to reflect resolving postoperative subsegmental atelectasis. No evidence of pulmonary edema. Heart size is normal. Upper mediastinal contours are within normal limits. IMPRESSION: 1. Postoperative changes and support apparatus, as above. 2. Small bilateral  pleural effusions with postoperative subsegmental atelectasis in the lung bases bilaterally. Electronically Signed   By: Trudie Reed M.D.   On: 05/02/2018 08:16    Cardiac Studies     Patient Profile     57 y.o. male Hx of CAD   S/P CABG in May 2019  Presented with severe SOB   Found to have large pericardial effusion.    Assessment & Plan    1  Pericardial effusion     Pt is s/p window with drainage.   Doing well   Echo today shows trivial pericardial effusion   (L pleural effusion is present)   Hemodynamics OK though heart rate a little high  Agree with low dose lopressor  2  CAD  S/p CABG in May 2019    Cont ASA  3  DM  Continue insulin  4  Lipids   Not on a statin due to MELAS syndrome    For questions or updates, please contact [facility] Please consult [email or web address] for contact info under Cardiology/STEMI.      Signed, Dietrich Pates, MD  05/03/2018, 11:26 AM

## 2018-05-04 LAB — BODY FLUID CULTURE

## 2018-05-07 ENCOUNTER — Encounter (HOSPITAL_COMMUNITY): Payer: Self-pay

## 2018-05-10 ENCOUNTER — Telehealth: Payer: Self-pay

## 2018-05-10 NOTE — Telephone Encounter (Signed)
-----   Message from Minette BrineLisa B Welch sent at 05/03/2018 12:52 PM EDT ----- Regarding: TOC CALL  Please make TOC call dc [facility] 05/03/2018.  Patient has appt 05/13/18.

## 2018-05-10 NOTE — Telephone Encounter (Signed)
Spoke with patient regarding TOC from [facility]. The patient stated he was doing well and did not currently have any concerns. Patient is aware that he has an appointment on the 25th for follow-up.

## 2018-05-12 ENCOUNTER — Ambulatory Visit: Payer: Self-pay | Admitting: Cardiology

## 2018-05-12 ENCOUNTER — Other Ambulatory Visit: Payer: Self-pay | Admitting: Cardiothoracic Surgery

## 2018-05-12 DIAGNOSIS — Z951 Presence of aortocoronary bypass graft: Secondary | ICD-10-CM

## 2018-05-13 ENCOUNTER — Other Ambulatory Visit: Payer: Self-pay

## 2018-05-13 ENCOUNTER — Encounter: Payer: Self-pay | Admitting: Cardiology

## 2018-05-13 ENCOUNTER — Ambulatory Visit (INDEPENDENT_AMBULATORY_CARE_PROVIDER_SITE_OTHER): Payer: Self-pay | Admitting: Cardiothoracic Surgery

## 2018-05-13 ENCOUNTER — Encounter: Payer: Self-pay | Admitting: Cardiothoracic Surgery

## 2018-05-13 ENCOUNTER — Ambulatory Visit
Admission: RE | Admit: 2018-05-13 | Discharge: 2018-05-13 | Disposition: A | Discharge status: Home / Self Care | Payer: No Typology Code available for payment source | Source: Ambulatory Visit | Attending: Cardiothoracic Surgery | Admitting: Cardiothoracic Surgery

## 2018-05-13 ENCOUNTER — Ambulatory Visit (INDEPENDENT_AMBULATORY_CARE_PROVIDER_SITE_OTHER): Payer: No Typology Code available for payment source | Admitting: Cardiology

## 2018-05-13 VITALS — BP 94/60 | HR 86 | Ht 66.5 in | Wt 128.4 lb

## 2018-05-13 VITALS — BP 128/82 | HR 85 | Resp 16 | Ht 66.5 in | Wt 138.0 lb

## 2018-05-13 DIAGNOSIS — Z951 Presence of aortocoronary bypass graft: Secondary | ICD-10-CM

## 2018-05-13 DIAGNOSIS — E088 Diabetes mellitus due to underlying condition with unspecified complications: Secondary | ICD-10-CM

## 2018-05-13 DIAGNOSIS — I2510 Atherosclerotic heart disease of native coronary artery without angina pectoris: Secondary | ICD-10-CM

## 2018-05-13 DIAGNOSIS — I3139 Other pericardial effusion (noninflammatory): Secondary | ICD-10-CM

## 2018-05-13 DIAGNOSIS — I313 Pericardial effusion (noninflammatory): Secondary | ICD-10-CM

## 2018-05-13 DIAGNOSIS — N183 Chronic kidney disease, stage 3 unspecified: Secondary | ICD-10-CM

## 2018-05-13 DIAGNOSIS — I10 Essential (primary) hypertension: Secondary | ICD-10-CM

## 2018-05-13 DIAGNOSIS — Z09 Encounter for follow-up examination after completed treatment for conditions other than malignant neoplasm: Secondary | ICD-10-CM

## 2018-05-13 NOTE — Patient Instructions (Signed)
Medication Instructions:  Your physician recommends that you continue on your current medications as directed. Please refer to the Current Medication list given to you today.  Labwork: Your physician recommends that you have the following labs drawn: BMP, liver and lipid panel in 1 month  Testing/Procedures: None  Follow-Up: Your physician recommends that you schedule a follow-up appointment in: 4 months  Any Other Special Instructions Will Be Listed Below (If Applicable).     If you need a refill on your cardiac medications before your next appointment, please call your pharmacy.   [facility]  Garey HamAshley A, RN, BSN

## 2018-05-13 NOTE — Progress Notes (Signed)
[address]       [place] [id number]             [phone]                  Daniel GableRandal Kerrick [place] [facility] Medical Record #[id number] Date of Birth: 23-Oct-1960  Referring QV:[id number]:Revankar, Aundra Dubinajan R, MD Primary Cardiology: Primary Care:[name], Excell SeltzerJohn J., MD  Chief Complaint:  Follow Up Visit 04/29/2018  PREOPERATIVE DIAGNOSIS:  Large pericardial effusion.  POSTOPERATIVE DIAGNOSIS:  Large pericardial effusion.  SURGICAL PROCEDURE:  Subxiphoid pericardial window with drainage of pericardial effusion.  SURGEON:  Sheliah PlaneEdward Wm Fruchter, MD  03/12/2018  PREOPERATIVE DIAGNOSES:  Severe 3-vessel coronary artery disease with unstable angina.  POSTOPERATIVE DIAGNOSES:  Severe 3-vessel coronary artery disease with unstable angina.  SURGICAL PROCEDURE:  Coronary artery bypass grafting x4 with the left internal mammary to the left anterior descending coronary artery, reverse saphenous vein graft to the diagonal coronary artery, sequential reverse saphenous vein graft to the  intermediate and distal circumflex coronary artery with right thigh and calf endovein greater saphenous harvesting endoscopically.  SURGEON:  Sheliah PlaneEdward Kareli Hossain, MD    History of Present Illness:      Patient returns the office today in follow-up after recent coronary artery bypass grafting and subsequent readmission with pericardial effusion nonbloody.  Overall the patient feels much better now.  Denies shortness of breath      Zubrod Score: At the time of surgery this patient's most appropriate activity status/level should be described as: [x]     0    Normal activity, no symptoms []     1    Restricted in physical strenuous activity but ambulatory, able to do out light work []     2    Ambulatory and capable of self care, unable to do work activities, up and about                 >50 % of waking hours                                                                                   []     3     Only limited self care, in bed greater than 50% of waking hours []     4    Completely disabled, no self care, confined to bed or chair []     5    Moribund  Social History   Tobacco Use  Smoking Status Never Smoker  Smokeless Tobacco Former NeurosurgeonUser  . Types: Chew       No Known Allergies  Current Outpatient Medications  Medication Sig Dispense Refill  . aspirin EC 81 MG EC tablet Take 1 tablet (81 mg total) by mouth daily.    Marland Kitchen. glipiZIDE (GLUCOTROL XL) 10 MG 24 hr tablet Take 10 mg by mouth 2 (two) times daily.    . metoprolol tartrate (LOPRESSOR) 25 MG tablet Take 0.5 tablets (12.5 mg total) by mouth 2 (two) times daily. 180 tablet 4  . pioglitazone (ACTOS) 30 MG tablet Take 30 mg by mouth every morning.     . sitaGLIPtin (JANUVIA) 100 MG tablet Take 100 mg by  mouth every morning.      No current facility-administered medications for this visit.        Physical Exam: BP 128/82 (BP Location: Right Arm, Patient Position: Sitting, Cuff Size: Normal)   Pulse 85   Resp 16   Ht 5' 6.5" (1.689 m)   Wt 138 lb (62.6 kg)   SpO2 99% Comment: RA  BMI 21.94 kg/m   General appearance: alert and cooperative Neurologic: intact Heart: regular rate and rhythm, S1, S2 normal, no murmur, click, rub or gallop Lungs: clear to auscultation bilaterally Abdomen: soft, non-tender; bowel sounds normal; no masses,  no organomegaly Extremities: extremities normal, atraumatic, no cyanosis or edema and Homans sign is negative, no sign of DVT Wound: Sternum stable and well-healed subxiphoid sternal incision also well-healed   Diagnostic Studies & Laboratory data:         Recent Radiology Findings: Dg Chest 2 View  Result Date: 05/13/2018 CLINICAL DATA:  Status post CABG. EXAM: CHEST - 2 VIEW COMPARISON:  Sub [id number] FINDINGS: Sequelae of CABG are again identified. The lungs are well inflated with improved aeration of the lung bases and resolved pleural effusions. The lungs are currently clear. No  pneumothorax is identified. Bilateral nipple shadows are noted. No acute osseous abnormality is seen. IMPRESSION: Resolved pleural effusions.  No active disease. Electronically Signed   By: Sebastian Ache M.D.   On: 05/13/2018 09:16      I have independently reviewed the above radiology findings and reviewed findings  with the patient.  Recent Labs: Lab Results  Component Value Date   WBC 8.9 05/02/2018   HGB 11.5 (L) 05/02/2018   HCT 35.7 (L) 05/02/2018   PLT 183 05/02/2018   GLUCOSE 122 (H) 05/03/2018   ALT 14 05/02/2018   AST 17 05/02/2018   NA 137 05/03/2018   K 4.8 05/03/2018   CL 106 05/03/2018   CREATININE 1.36 (H) 05/03/2018   BUN 17 05/03/2018   CO2 24 05/03/2018   INR 1.11 04/30/2018   HGBA1C 7.1 (H) 03/11/2018      Assessment / Plan:      Patient improved postoperatively after drainage of moderate size pericardial effusion, chest x-ray shows resolution of the pleural effusions Overall the patient is making good progress Plan to see him back in 1 month He will discuss use of statins with cardiology   [name] 05/13/2018 9:34 AM

## 2018-05-13 NOTE — Progress Notes (Signed)
Cardiology Office Note:    Date:  05/13/2018   ID:  Daniel Malone, DOB [birth year], MRN [id number]  PCP:  Nonnie DoneSlatosky, [name], MD  Cardiologist:  Garwin Brothersajan R Claudie Rathbone, MD   Referring MD: Nonnie DoneSlatosky, [name], MD    ASSESSMENT:    1. Coronary artery disease involving native coronary artery of native heart without angina pectoris   2. Essential hypertension   3. Chronic renal disease, stage 3, moderately decreased glomerular filtration rate (GFR) between 30-59 mL/min/1.73 square meter (HCC)   4. Diabetes mellitus due to underlying condition with complication, without long-term current use of insulin (HCC)   5. S/P CABG x 4    PLAN:    In order of problems listed above:  1. Secondary prevention stressed with the patient.  Importance of compliance with diet and medication stressed and he vocalized understanding.  His blood pressure is stable.  Diet was discussed with dyslipidemia. 2. I reviewed chest x-ray report and discussed with him and reassured him. 3. He will be back in 1 month for liver lipid check fasting and also Chem-7. 4. Patient will be seen in follow-up appointment in 4 months or earlier if the patient has any concerns    Medication Adjustments/Labs and Tests Ordered: Current medicines are reviewed at length with the patient today.  Concerns regarding medicines are outlined above.  No orders of the defined types were placed in this encounter.  No orders of the defined types were placed in this encounter.    Chief Complaint  Patient presents with  . Follow-up     History of Present Illness:    Daniel Malone is a 57 y.o. male.  The patient was evaluated by me after bypass surgery.  He was found to have a large pericardial effusion almost on the verge of tamponade and [facility] but this was drained.  Subsequently he feels very well.  No chest pain orthopnea or PND.  He is ambulating on a regular basis.  He has not had any symptoms or dizziness or any  such problems.  He mentions to me that he feels like a "new person".  At the time of my evaluation, the patient is alert awake oriented and in no distress.  Past Medical History:  Diagnosis Date  . Chronic renal disease, stage 3, moderately decreased glomerular filtration rate (GFR) between 30-59 mL/min/1.73 square meter (HCC) 03/10/2018  . Diabetes mellitus, type 2 (HCC) 02/23/2018  . Hearing loss 02/23/2018  . Hypertension 02/23/2018  . MELAS syndrome (HCC) 02/23/2018  . Peripheral vascular disease (HCC) 02/23/2018    Past Surgical History:  Procedure Laterality Date  . CORONARY ARTERY BYPASS GRAFT N/A 03/12/2018   Procedure: CORONARY ARTERY BYPASS GRAFTING (CABG) x 4 WITH ENODOSCOPIC HARVESTING OF RIGHT SAPHENOUS VEIN.  LIMA TO LAD, SVG TO DIAGONAL, SEQUENTIAL SVG TO INTERMEDIUS RAMUS AND DISTAL CIRCUMFLEX;  Surgeon: Delight OvensGerhardt, Edward B, MD;  Location: [facility] OR;  Service: Open Heart Surgery;  Laterality: N/A;  . LEFT HEART CATH AND CORONARY ANGIOGRAPHY N/A 03/10/2018   Procedure: LEFT HEART CATH AND CORONARY ANGIOGRAPHY;  Surgeon: Corky CraftsVaranasi, Jayadeep S, MD;  [facility];  Service: Cardiovascular;  Laterality: N/A;  . NASAL SINUS SURGERY    . SUBXYPHOID PERICARDIAL WINDOW N/A 04/30/2018   Procedure: SUBXYPHOID PERICARDIAL WINDOW WITH DRAINAGE OF PERICARDIAL EFFUSION;  Surgeon: Delight OvensGerhardt, Edward B, MD;  Location: [facility] OR;  Service: Thoracic;  Laterality: N/A;  . TEE WITHOUT CARDIOVERSION N/A 03/12/2018   Procedure:  TRANSESOPHAGEAL ECHOCARDIOGRAM (TEE);  Surgeon: [name], MD;  Location: [facility] OR;  Service: Open Heart Surgery;  Laterality: N/A;  . TEE WITHOUT CARDIOVERSION N/A 04/30/2018   Procedure: TRANSESOPHAGEAL ECHOCARDIOGRAM (TEE);  Surgeon: [name], MD;  Location: [facility] OR;  Service: Thoracic;  Laterality: N/A;    Current Medications: Current Meds  Medication Sig  . aspirin EC 81 MG EC tablet Take 1 tablet (81 mg total) by mouth daily.  Marland Kitchen glipiZIDE (GLUCOTROL XL) 10 MG  24 hr tablet Take 10 mg by mouth 2 (two) times daily.  . metoprolol tartrate (LOPRESSOR) 25 MG tablet Take 0.5 tablets (12.5 mg total) by mouth 2 (two) times daily.  . pioglitazone (ACTOS) 30 MG tablet Take 30 mg by mouth every morning.   . sitaGLIPtin (JANUVIA) 100 MG tablet Take 100 mg by mouth every morning.      Allergies:   Patient has no known allergies.   Social History   Socioeconomic History  . Marital status: Married    Spouse name: Not on file  . Number of children: 1  . Years of education: Not on file  . Highest education level: Not on file  Occupational History  . Not on file  Social Needs  . Financial resource strain: Not on file  . Food insecurity:    Worry: Not on file    Inability: Not on file  . Transportation needs:    Medical: Not on file    Non-medical: Not on file  Tobacco Use  . Smoking status: Never Smoker  . Smokeless tobacco: Former Neurosurgeon    Types: Chew  Substance and Sexual Activity  . Alcohol use: Not Currently  . Drug use: Not Currently  . Sexual activity: Not on file  Lifestyle  . Physical activity:    Days per week: Not on file    Minutes per session: Not on file  . Stress: Not on file  Relationships  . Social connections:    Talks on phone: Not on file    Gets together: Not on file    Attends religious service: Not on file    Active member of club or organization: Not on file    Attends meetings of clubs or organizations: Not on file    Relationship status: Not on file  Other Topics Concern  . Not on file  Social History Narrative  . Not on file     Family History: The patient's family history includes CAD in his father; Diabetes in his mother.  ROS:   Please see the history of present illness.    All other systems reviewed and are negative.  EKGs/Labs/Other Studies Reviewed:    The following studies were reviewed today: I discussed my findings with the patient at extensive length.   Recent Labs: 03/13/2018: Magnesium  2.2 04/29/2018: B Natriuretic Peptide 271.1 05/02/2018: ALT 14; Hemoglobin 11.5; Platelets 183 05/03/2018: BUN 17; Creatinine, Ser 1.36; Potassium 4.8; Sodium 137  Recent Lipid Panel No results found for: CHOL, TRIG, HDL, CHOLHDL, VLDL, LDLCALC, LDLDIRECT  Physical Exam:    VS:  BP 94/60 (BP Location: Right Arm, Patient Position: Sitting, Cuff Size: Normal)   Pulse 86   Ht 5' 6.5" (1.689 m)   Wt 128 lb 6.4 oz (58.2 kg)   SpO2 99%   BMI 20.41 kg/m     Wt Readings from Last 3 Encounters:  05/13/18 128 lb 6.4 oz (58.2 kg)  05/13/18 138 lb (62.6 kg)  05/03/18 126 lb 12.8 oz (  57.5 kg)     GEN: Patient is in no acute distress HEENT: Normal NECK: No JVD; No carotid bruits LYMPHATICS: No lymphadenopathy CARDIAC: Hear sounds regular, 2/6 systolic murmur at the apex. RESPIRATORY:  Clear to auscultation without rales, wheezing or rhonchi  ABDOMEN: Soft, non-tender, non-distended MUSCULOSKELETAL:  No edema; No deformity  SKIN: Warm and dry NEUROLOGIC:  Alert and oriented x 3 PSYCHIATRIC:  Normal affect   Signed, Garwin Brothers, MD  05/13/2018 3:46 PM    [facility]

## 2018-06-01 LAB — FUNGUS CULTURE WITH STAIN

## 2018-06-01 LAB — FUNGUS CULTURE RESULT

## 2018-06-01 LAB — FUNGAL ORGANISM REFLEX

## 2018-06-14 LAB — ACID FAST CULTURE WITH REFLEXED SENSITIVITIES (MYCOBACTERIA): Acid Fast Culture: NEGATIVE

## 2018-07-01 ENCOUNTER — Ambulatory Visit (INDEPENDENT_AMBULATORY_CARE_PROVIDER_SITE_OTHER): Payer: Self-pay | Admitting: Cardiothoracic Surgery

## 2018-07-01 ENCOUNTER — Other Ambulatory Visit: Payer: Self-pay | Admitting: *Deleted

## 2018-07-01 ENCOUNTER — Other Ambulatory Visit: Payer: Self-pay

## 2018-07-01 VITALS — BP 137/83 | HR 88 | Resp 16 | Ht 66.5 in | Wt 136.2 lb

## 2018-07-01 DIAGNOSIS — I313 Pericardial effusion (noninflammatory): Secondary | ICD-10-CM

## 2018-07-01 DIAGNOSIS — I3139 Other pericardial effusion (noninflammatory): Secondary | ICD-10-CM

## 2018-07-01 DIAGNOSIS — Z951 Presence of aortocoronary bypass graft: Secondary | ICD-10-CM

## 2018-07-01 DIAGNOSIS — Z09 Encounter for follow-up examination after completed treatment for conditions other than malignant neoplasm: Secondary | ICD-10-CM

## 2018-07-01 NOTE — Progress Notes (Signed)
[address]       [place] [id number]             [phone]                  Norval GableRandal [place] Medical Record #[id number] Date of Birth: Nov 19, 1960  Referring IO:[id number]:Revankar, Aundra Dubinajan R, MD Primary Cardiology: Primary Care:[name], Excell SeltzerJohn J., MD  Chief Complaint:  Follow Up Visit 04/29/2018  PREOPERATIVE DIAGNOSIS:  Large pericardial effusion.  POSTOPERATIVE DIAGNOSIS:  Large pericardial effusion.  SURGICAL PROCEDURE:  Subxiphoid pericardial window with drainage of pericardial effusion.  SURGEON:  Sheliah PlaneEdward Deniel Mcquiston, MD  03/12/2018  PREOPERATIVE DIAGNOSES:  Severe 3-vessel coronary artery disease with unstable angina.  POSTOPERATIVE DIAGNOSES:  Severe 3-vessel coronary artery disease with unstable angina.  SURGICAL PROCEDURE:  Coronary artery bypass grafting x4 with the left internal mammary to the left anterior descending coronary artery, reverse saphenous vein graft to the diagonal coronary artery, sequential reverse saphenous vein graft to the  intermediate and distal circumflex coronary artery with right thigh and calf endovein greater saphenous harvesting endoscopically.  SURGEON:  Sheliah PlaneEdward Aurthur Wingerter, MD    History of Present Illness:      Patient returns the office today in follow-up aftercoronary artery bypass grafting and subsequent readmission with pericardial effusion no- nbloody.  Overall the patient feels much better now.  Denies shortness of breath Patient has returned to normal activities without limitations.  He denies recurrent chest pain or shortness of breath.  Echo done in July showed no evidence of recurrent effusion.     Zubrod Score: At the time of surgery this patient's most appropriate activity status/level should be described as: [x]     0    Normal activity, no symptoms []     1    Restricted in physical strenuous activity but ambulatory, able to do out light work []     2    Ambulatory and capable of self care,  unable to do work activities, up and about                 >50 % of waking hours                                                                                   []     3    Only limited self care, in bed greater than 50% of waking hours []     4    Completely disabled, no self care, confined to bed or chair []     5    Moribund  Social History   Tobacco Use  Smoking Status Never Smoker  Smokeless Tobacco Former NeurosurgeonUser  . Types: Chew       No Known Allergies  Current Outpatient Medications  Medication Sig Dispense Refill  . aspirin EC 81 MG EC tablet Take 1 tablet (81 mg total) by mouth daily.    Marland Kitchen. glipiZIDE (GLUCOTROL XL) 10 MG 24 hr tablet Take 10 mg by mouth 2 (two) times daily.    . metoprolol tartrate (LOPRESSOR) 25 MG tablet Take 0.5 tablets (12.5 mg total) by mouth 2 (two) times daily. 180 tablet 4  .  pioglitazone (ACTOS) 30 MG tablet Take 30 mg by mouth every morning.     . sitaGLIPtin (JANUVIA) 100 MG tablet Take 100 mg by mouth every morning.      No current facility-administered medications for this visit.        Physical Exam: BP 137/83 (BP Location: Right Arm, Patient Position: Sitting, Cuff Size: Normal)   Pulse 88   Resp 16   Ht 5' 6.5" (1.689 m)   Wt 136 lb 3.2 oz (61.8 kg)   SpO2 97% Comment: ON RA  BMI 21.65 kg/m  General appearance: alert, cooperative and no distress Head: Normocephalic, without obvious abnormality, atraumatic Neck: no adenopathy, no carotid bruit, no JVD, supple, symmetrical, trachea midline and thyroid not enlarged, symmetric, no tenderness/mass/nodules Lymph nodes: Cervical, supraclavicular, and axillary nodes normal. Cardio: regular rate and rhythm, S1, S2 normal, no murmur, click, rub or gallop GI: soft, non-tender; bowel sounds normal; no masses,  no organomegaly Extremities: extremities normal, atraumatic, no cyanosis or edema and Homans sign is negative, no sign of DVT Neurologic: Grossly normal  Diagnostic Studies & Laboratory  data:         Recent Radiology Findings: No results found.    I have independently reviewed the above radiology findings and reviewed findings  with the patient.  Recent Labs: Lab Results  Component Value Date   WBC 8.9 05/02/2018   HGB 11.5 (L) 05/02/2018   HCT 35.7 (L) 05/02/2018   PLT 183 05/02/2018   GLUCOSE 122 (H) 05/03/2018   ALT 14 05/02/2018   AST 17 05/02/2018   NA 137 05/03/2018   K 4.8 05/03/2018   CL 106 05/03/2018   CREATININE 1.36 (H) 05/03/2018   BUN 17 05/03/2018   CO2 24 05/03/2018   INR 1.11 04/30/2018   HGBA1C 7.1 (H) 03/11/2018      Assessment / Plan:   Patient stable after coronary artery bypass grafting and subsequent development of postop pericardial effusion which was drained.  Overall the patient is making good progress. 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   [name] 07/01/2018 2:53 PM

## 2018-10-22 IMAGING — DX DG CHEST 2V
2 series · 2 of 2 positions shown · non-contrast
Comparison: PA and lateral chest x-ray March 17, 2018

CLINICAL DATA: Status post CABG on March 12, 2018. No current chest
complaints.

EXAM:
CHEST - 2 VIEW

[dg chest 2 view (1 of 2)]
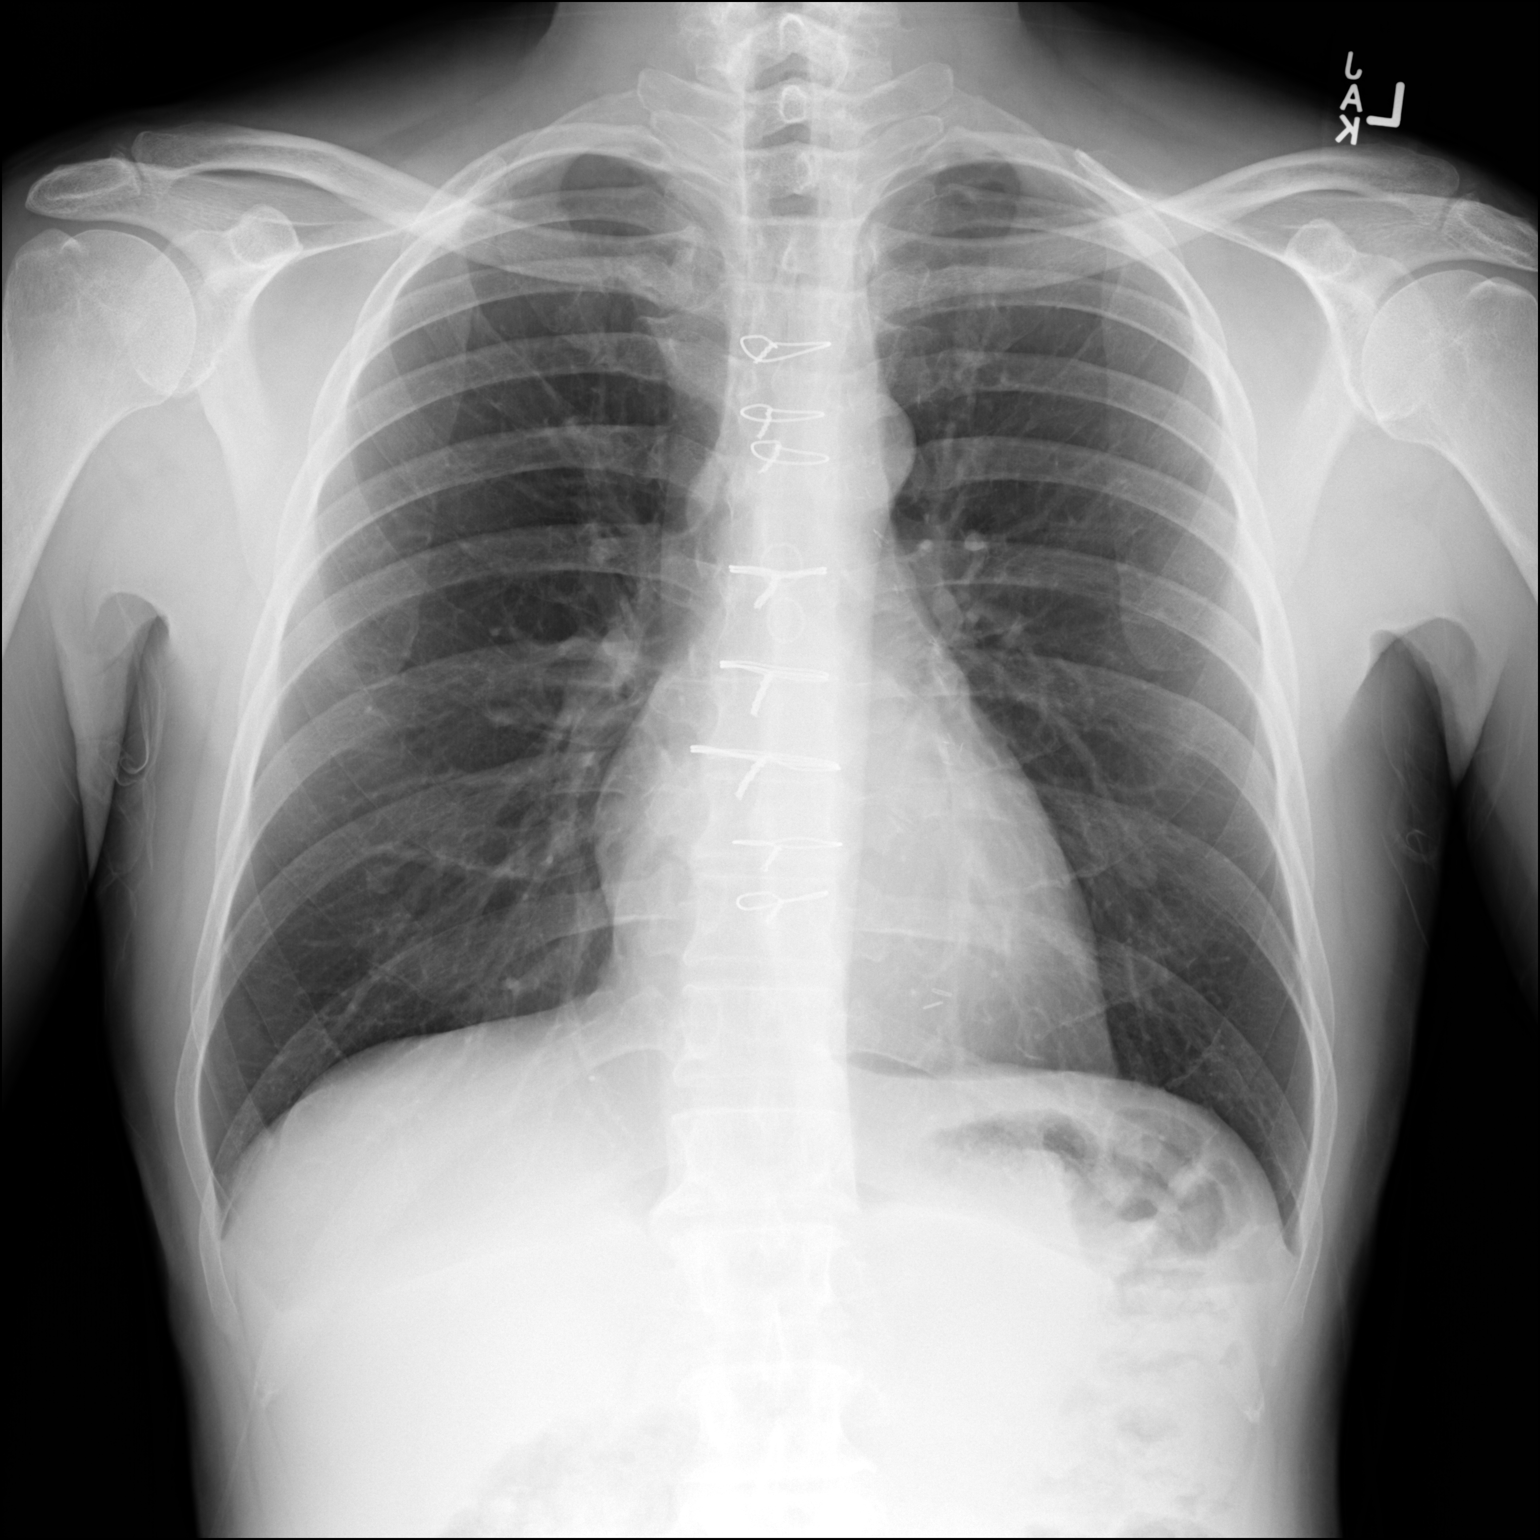

[dg chest 2 view (2 of 2)]
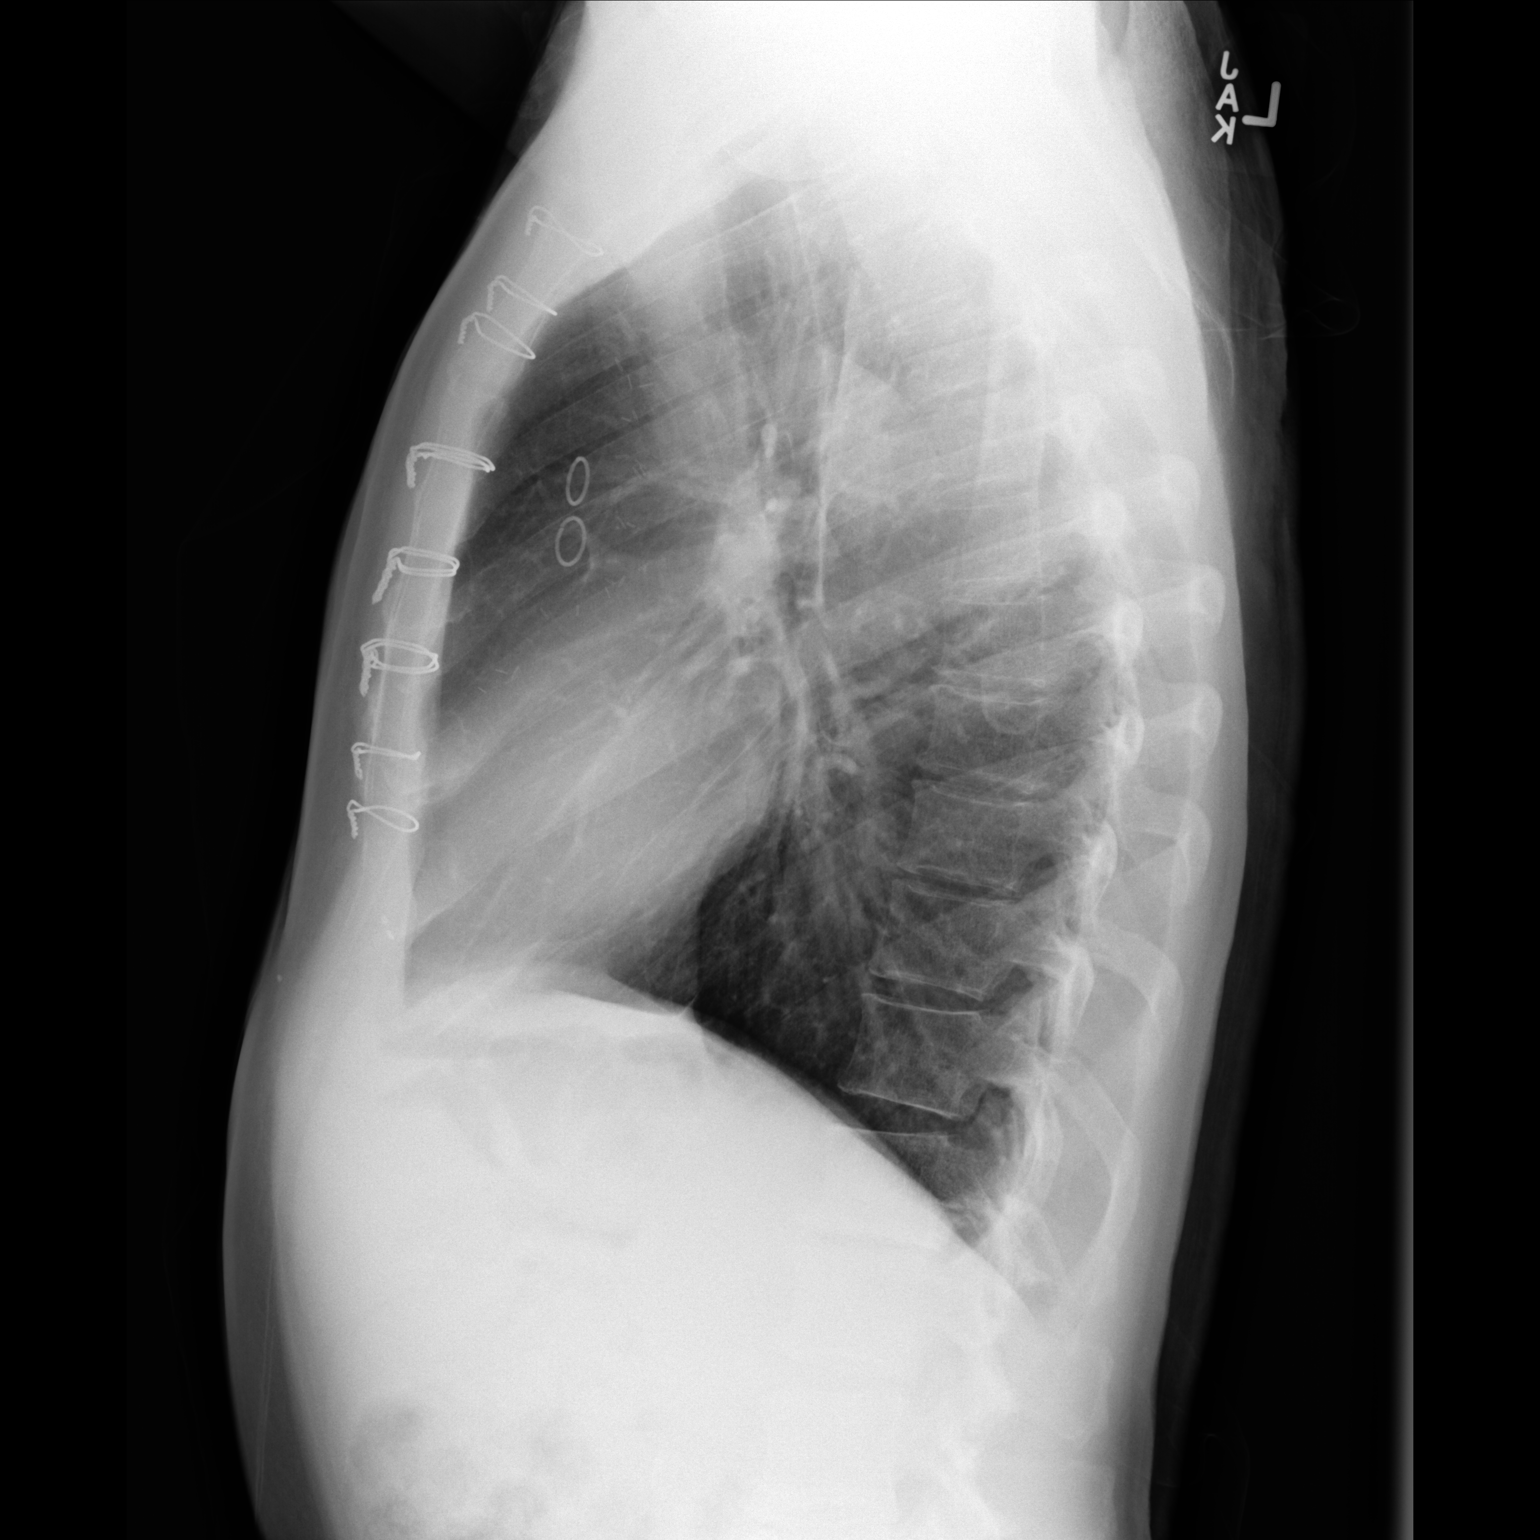

[2 of 2 positions shown; findings below may reference images not displayed]

FINDINGS: The lungs are well-expanded and clear. The heart and pulmonary
vascularity are normal. The mediastinum is normal in width. The
sternal wires are intact. There is no pleural effusion or
pneumothorax. The bony thorax is unremarkable.
IMPRESSION: There is no residual pleural effusion nor other cardiopulmonary
abnormality.

## 2018-11-09 IMAGING — DX DG CHEST 1V PORT
1 series · 1 of 1 positions shown · non-contrast
Comparison: 04/29/2018 and older exams.

CLINICAL DATA: Status post pericardial window.

EXAM:
PORTABLE CHEST 1 VIEW

[chest]
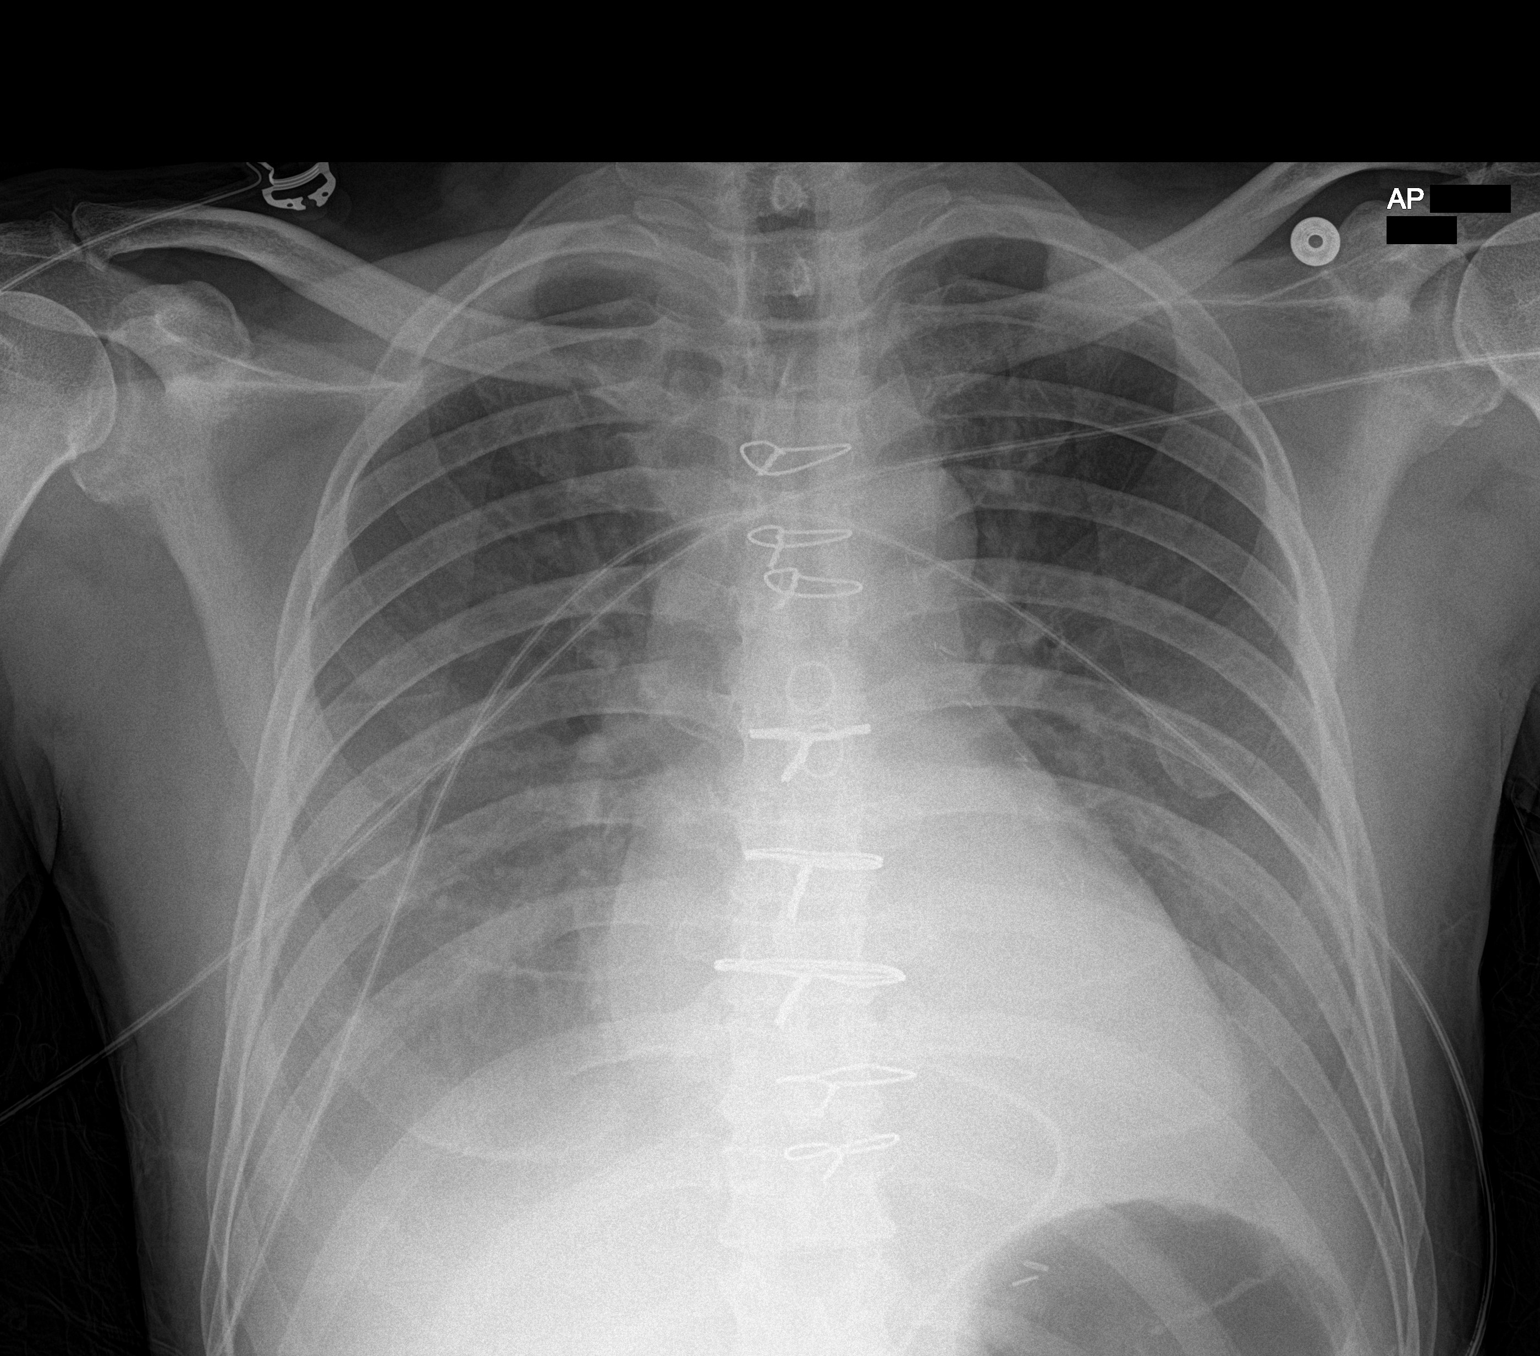

[1 of 1 positions shown; findings below may reference images not displayed]

FINDINGS: Cardiac silhouette is normal in size and configuration. No
mediastinal widening. No mediastinal or hilar masses.

There is opacity in the lower lungs bilaterally obscuring
hemidiaphragms consistent with moderate bilateral pleural effusions,
which appear larger than they did on the previous day's study. No
convincing pneumonia. No evidence of pulmonary edema.

No pneumothorax.

Stable changes from prior CABG surgery.
IMPRESSION: 1. Bilateral pleural effusions increased in size when compared to
the prior exam.
2. No convincing pneumonia or evidence of pulmonary edema. No
pneumothorax.
3. Cardiac silhouette normal in size and configuration.

## 2018-11-12 IMAGING — CR DG CHEST 2V
2 series · 2 of 2 positions shown · non-contrast
Comparison: 05/02/2018 and 04/29/2018

CLINICAL DATA: Pleural effusions.  Pericardial effusion.

EXAM:
CHEST - 2 VIEW

[chest pa]
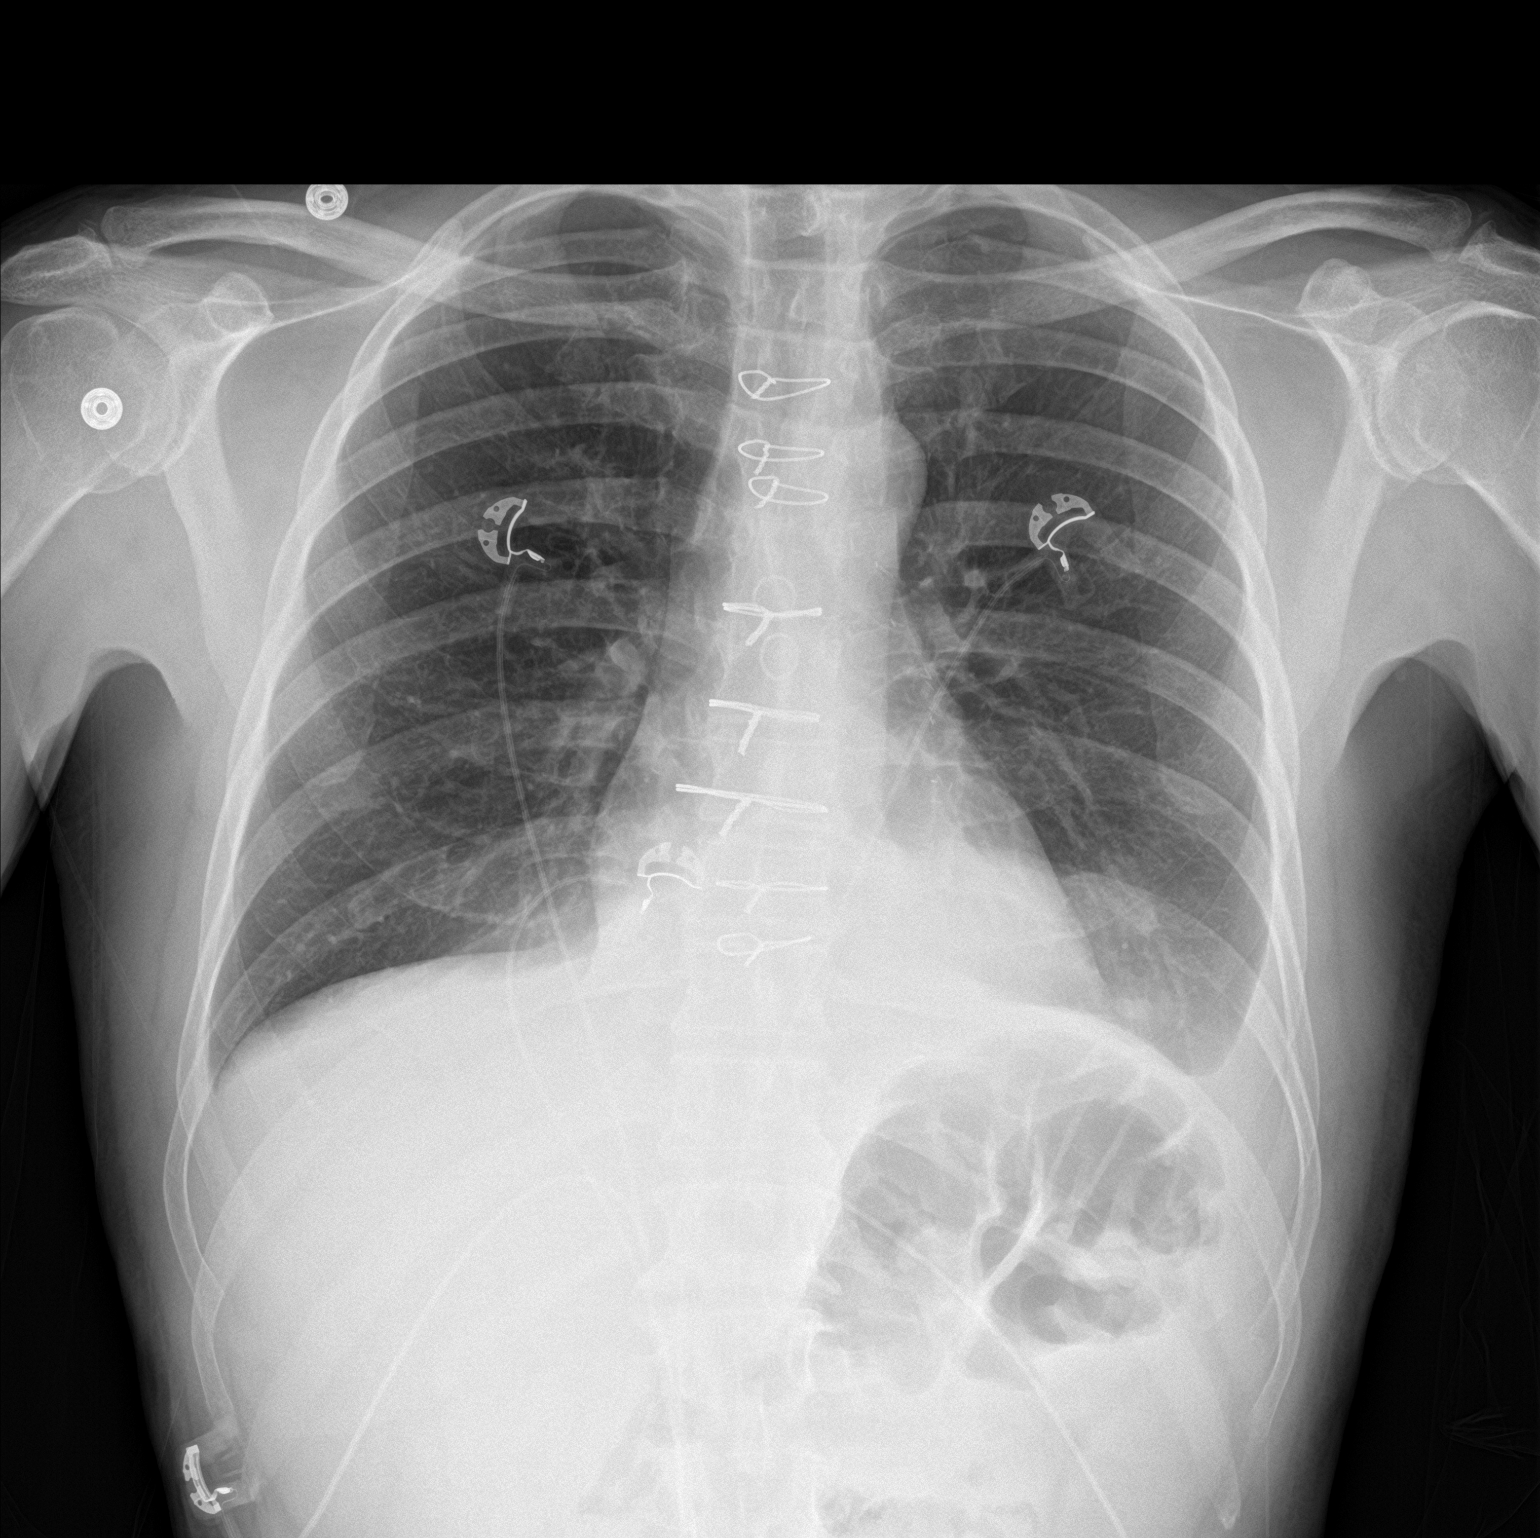

[chest lat]
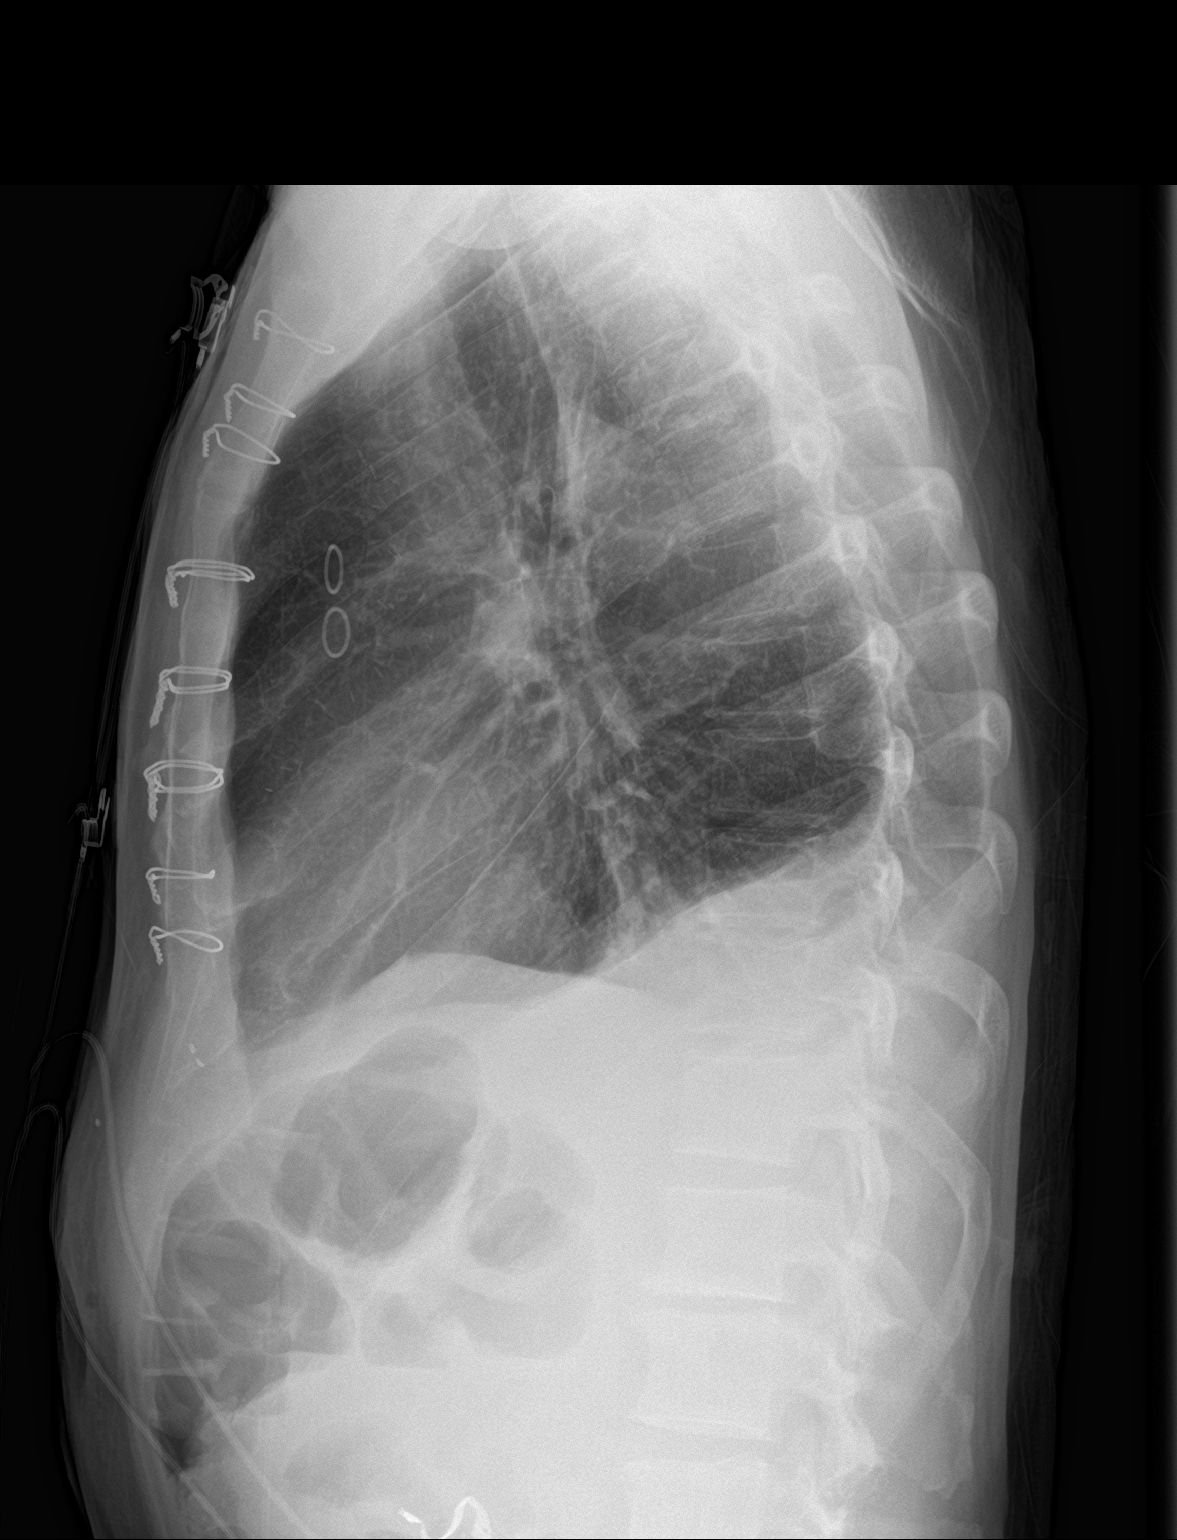

[2 of 2 positions shown; findings below may reference images not displayed]

FINDINGS: The heart size and pulmonary vascularity are normal. Small bilateral
pleural effusions, left greater than right, both diminished since
the prior study. Pericardial drain has been removed.

No infiltrates.  CABG.

No bone abnormality.
IMPRESSION: Diminished bilateral small pleural effusions. Cardiac silhouette is
normal.

## 2023-11-21 ENCOUNTER — Encounter (HOSPITAL_BASED_OUTPATIENT_CLINIC_OR_DEPARTMENT_OTHER): Payer: Self-pay | Admitting: Emergency Medicine

## 2023-11-21 ENCOUNTER — Emergency Department (HOSPITAL_BASED_OUTPATIENT_CLINIC_OR_DEPARTMENT_OTHER): Payer: Self-pay

## 2023-11-21 ENCOUNTER — Observation Stay (HOSPITAL_BASED_OUTPATIENT_CLINIC_OR_DEPARTMENT_OTHER)
Admission: EM | Admit: 2023-11-21 | Discharge: 2023-11-22 | Disposition: A | Discharge status: Home / Self Care | Payer: Self-pay | Attending: Internal Medicine | Admitting: Internal Medicine

## 2023-11-21 DIAGNOSIS — N183 Chronic kidney disease, stage 3 unspecified: Secondary | ICD-10-CM | POA: Diagnosis present

## 2023-11-21 DIAGNOSIS — D649 Anemia, unspecified: Secondary | ICD-10-CM | POA: Insufficient documentation

## 2023-11-21 DIAGNOSIS — R739 Hyperglycemia, unspecified: Principal | ICD-10-CM

## 2023-11-21 DIAGNOSIS — Z7982 Long term (current) use of aspirin: Secondary | ICD-10-CM | POA: Insufficient documentation

## 2023-11-21 DIAGNOSIS — E1100 Type 2 diabetes mellitus with hyperosmolarity without nonketotic hyperglycemic-hyperosmolar coma (NKHHC): Secondary | ICD-10-CM | POA: Diagnosis present

## 2023-11-21 DIAGNOSIS — H9190 Unspecified hearing loss, unspecified ear: Secondary | ICD-10-CM

## 2023-11-21 DIAGNOSIS — E871 Hypo-osmolality and hyponatremia: Secondary | ICD-10-CM | POA: Insufficient documentation

## 2023-11-21 DIAGNOSIS — E1122 Type 2 diabetes mellitus with diabetic chronic kidney disease: Secondary | ICD-10-CM | POA: Insufficient documentation

## 2023-11-21 DIAGNOSIS — Z7984 Long term (current) use of oral hypoglycemic drugs: Secondary | ICD-10-CM | POA: Insufficient documentation

## 2023-11-21 DIAGNOSIS — Z951 Presence of aortocoronary bypass graft: Secondary | ICD-10-CM

## 2023-11-21 DIAGNOSIS — N1832 Chronic kidney disease, stage 3b: Secondary | ICD-10-CM | POA: Insufficient documentation

## 2023-11-21 DIAGNOSIS — I10 Essential (primary) hypertension: Secondary | ICD-10-CM | POA: Diagnosis present

## 2023-11-21 DIAGNOSIS — E1165 Type 2 diabetes mellitus with hyperglycemia: Principal | ICD-10-CM | POA: Insufficient documentation

## 2023-11-21 DIAGNOSIS — I129 Hypertensive chronic kidney disease with stage 1 through stage 4 chronic kidney disease, or unspecified chronic kidney disease: Secondary | ICD-10-CM | POA: Insufficient documentation

## 2023-11-21 DIAGNOSIS — Z87891 Personal history of nicotine dependence: Secondary | ICD-10-CM | POA: Insufficient documentation

## 2023-11-21 DIAGNOSIS — Z79899 Other long term (current) drug therapy: Secondary | ICD-10-CM | POA: Insufficient documentation

## 2023-11-21 DIAGNOSIS — E8841 MELAS syndrome: Secondary | ICD-10-CM | POA: Diagnosis present

## 2023-11-21 DIAGNOSIS — I2510 Atherosclerotic heart disease of native coronary artery without angina pectoris: Secondary | ICD-10-CM | POA: Diagnosis present

## 2023-11-21 LAB — I-STAT VENOUS BLOOD GAS, ED
Acid-base deficit: 1 mmol/L (ref 0.0–2.0)
Bicarbonate: 26.6 mmol/L (ref 20.0–28.0)
Calcium, Ion: 1.25 mmol/L (ref 1.15–1.40)
HCT: 42 % (ref 39.0–52.0)
Hemoglobin: 14.3 g/dL (ref 13.0–17.0)
O2 Saturation: 49 %
Potassium: 5.4 mmol/L — ABNORMAL HIGH (ref 3.5–5.1)
Sodium: 125 mmol/L — ABNORMAL LOW (ref 135–145)
TCO2: 28 mmol/L (ref 22–32)
pCO2, Ven: 52.6 mm[Hg] (ref 44–60)
pH, Ven: 7.311 (ref 7.25–7.43)
pO2, Ven: 29 mm[Hg] — CL (ref 32–45)

## 2023-11-21 LAB — CBC WITH DIFFERENTIAL/PLATELET
Abs Immature Granulocytes: 0.03 10*3/uL (ref 0.00–0.07)
Basophils Absolute: 0.1 10*3/uL (ref 0.0–0.1)
Basophils Relative: 1 %
Eosinophils Absolute: 0.1 10*3/uL (ref 0.0–0.5)
Eosinophils Relative: 1 %
HCT: 38.3 % — ABNORMAL LOW (ref 39.0–52.0)
Hemoglobin: 13.6 g/dL (ref 13.0–17.0)
Immature Granulocytes: 0 %
Lymphocytes Relative: 15 %
Lymphs Abs: 1.3 10*3/uL (ref 0.7–4.0)
MCH: 31.1 pg (ref 26.0–34.0)
MCHC: 35.5 g/dL (ref 30.0–36.0)
MCV: 87.6 fL (ref 80.0–100.0)
Monocytes Absolute: 0.5 10*3/uL (ref 0.1–1.0)
Monocytes Relative: 6 %
Neutro Abs: 6.3 10*3/uL (ref 1.7–7.7)
Neutrophils Relative %: 77 %
Platelets: 175 10*3/uL (ref 150–400)
RBC: 4.37 MIL/uL (ref 4.22–5.81)
RDW: 12 % (ref 11.5–15.5)
WBC: 8.2 10*3/uL (ref 4.0–10.5)
nRBC: 0 % (ref 0.0–0.2)

## 2023-11-21 LAB — GLUCOSE, CAPILLARY
Glucose-Capillary: 177 mg/dL — ABNORMAL HIGH (ref 70–99)
Glucose-Capillary: 144 mg/dL — ABNORMAL HIGH (ref 70–99)

## 2023-11-21 LAB — BASIC METABOLIC PANEL
Anion gap: 11 (ref 5–15)
Anion gap: 9 (ref 5–15)
BUN: 30 mg/dL — ABNORMAL HIGH (ref 8–23)
BUN: 26 mg/dL — ABNORMAL HIGH (ref 8–23)
CO2: 22 mmol/L (ref 22–32)
CO2: 20 mmol/L — ABNORMAL LOW (ref 22–32)
Calcium: 9.4 mg/dL (ref 8.9–10.3)
Calcium: 7.7 mg/dL — ABNORMAL LOW (ref 8.9–10.3)
Chloride: 90 mmol/L — ABNORMAL LOW (ref 98–111)
Chloride: 102 mmol/L (ref 98–111)
Creatinine, Ser: 1.4 mg/dL — ABNORMAL HIGH (ref 0.61–1.24)
Creatinine, Ser: 1 mg/dL (ref 0.61–1.24)
GFR, Estimated: 57 mL/min — ABNORMAL LOW (ref >60)
GFR, Estimated: >60 mL/min (ref >60)
Glucose, Bld: 630 mg/dL (ref 70–99)
Glucose, Bld: 407 mg/dL — ABNORMAL HIGH (ref 70–99)
Potassium: 5.2 mmol/L — ABNORMAL HIGH (ref 3.5–5.1)
Potassium: 3.6 mmol/L (ref 3.5–5.1)
Sodium: 123 mmol/L — ABNORMAL LOW (ref 135–145)
Sodium: 131 mmol/L — ABNORMAL LOW (ref 135–145)

## 2023-11-21 LAB — URINALYSIS, ROUTINE W REFLEX MICROSCOPIC
Bilirubin Urine: NEGATIVE
Glucose, UA: >=500 mg/dL — AB
Hgb urine dipstick: NEGATIVE
Ketones, ur: NEGATIVE mg/dL
Leukocytes,Ua: NEGATIVE
Nitrite: NEGATIVE
Protein, ur: 100 mg/dL — AB
Specific Gravity, Urine: 1.01 (ref 1.005–1.030)
pH: 5.5 (ref 5.0–8.0)

## 2023-11-21 LAB — URINALYSIS, MICROSCOPIC (REFLEX)

## 2023-11-21 LAB — CBG MONITORING, ED
Glucose-Capillary: 558 mg/dL (ref 70–99)
Glucose-Capillary: 387 mg/dL — ABNORMAL HIGH (ref 70–99)
Glucose-Capillary: 325 mg/dL — ABNORMAL HIGH (ref 70–99)

## 2023-11-21 LAB — BETA-HYDROXYBUTYRIC ACID: Beta-Hydroxybutyric Acid: 0.43 mmol/L — ABNORMAL HIGH (ref 0.05–0.27)

## 2023-11-21 LAB — OSMOLALITY: Osmolality: 312 mosm/kg — ABNORMAL HIGH (ref 275–295)

## 2023-11-21 MED ORDER — LACTATED RINGERS IV SOLN: INTRAVENOUS | Status: DC

## 2023-11-21 MED ORDER — DEXTROSE 50 % IV SOLN: 0.0000 mL | INTRAVENOUS | Status: DC | PRN

## 2023-11-21 MED ORDER — INSULIN REGULAR(HUMAN) IN NACL 100-0.9 UT/100ML-% IV SOLN
INTRAVENOUS | Status: DC
  Administered 2023-11-21: 8 [IU]/h via INTRAVENOUS
  Filled 2023-11-21: qty 100

## 2023-11-21 MED ORDER — METOPROLOL TARTRATE 12.5 MG HALF TABLET
12.5000 mg | ORAL_TABLET | Freq: Two times a day (BID) | ORAL | Status: DC
  Administered 2023-11-21: 12.5 mg via ORAL
  Filled 2023-11-21 (×2): qty 1

## 2023-11-21 MED ORDER — INSULIN ASPART 100 UNIT/ML IJ SOLN
0.0000 [IU] | Freq: Three times a day (TID) | INTRAMUSCULAR | Status: DC
  Administered 2023-11-22: 1 [IU] via SUBCUTANEOUS

## 2023-11-21 MED ORDER — ACETAMINOPHEN 325 MG PO TABS: 650.0000 mg | ORAL_TABLET | Freq: Four times a day (QID) | ORAL | Status: DC | PRN

## 2023-11-21 MED ORDER — ACETAMINOPHEN 650 MG RE SUPP
650.0000 mg | Freq: Four times a day (QID) | RECTAL | Status: DC | PRN
  Filled 2023-11-21: qty 1

## 2023-11-21 MED ORDER — INSULIN GLARGINE-YFGN 100 UNIT/ML ~~LOC~~ SOLN
10.0000 [IU] | Freq: Every day | SUBCUTANEOUS | Status: DC
  Administered 2023-11-21: 10 [IU] via SUBCUTANEOUS
  Filled 2023-11-21 (×2): qty 0.1

## 2023-11-21 MED ORDER — LACTATED RINGERS IV BOLUS
20.0000 mL/kg | Freq: Once | INTRAVENOUS | Status: AC
  Administered 2023-11-21: 956 mL via INTRAVENOUS

## 2023-11-21 MED ORDER — CHLORHEXIDINE GLUCONATE CLOTH 2 % EX PADS
6.0000 | MEDICATED_PAD | Freq: Every day | CUTANEOUS | Status: DC
  Administered 2023-11-22: 6 via TOPICAL

## 2023-11-21 MED ORDER — ENSURE ENLIVE PO LIQD: 237.0000 mL | Freq: Two times a day (BID) | ORAL | Status: DC

## 2023-11-21 MED ORDER — DEXTROSE IN LACTATED RINGERS 5 % IV SOLN: INTRAVENOUS | Status: DC

## 2023-11-21 MED ORDER — ORAL CARE MOUTH RINSE: 15.0000 mL | OROMUCOSAL | Status: DC | PRN

## 2023-11-21 MED ORDER — ACETAMINOPHEN 500 MG PO TABS
1000.0000 mg | ORAL_TABLET | Freq: Once | ORAL | Status: AC
Start: 2023-11-21 — End: 2023-11-21
  Administered 2023-11-21: 1000 mg via ORAL
  Filled 2023-11-21: qty 2

## 2023-11-21 MED ORDER — ASPIRIN 81 MG PO TBEC
81.0000 mg | DELAYED_RELEASE_TABLET | Freq: Every day | ORAL | Status: DC
  Administered 2023-11-22: 81 mg via ORAL
  Filled 2023-11-21: qty 1

## 2023-11-21 MED ORDER — ENOXAPARIN SODIUM 40 MG/0.4ML IJ SOSY
40.0000 mg | PREFILLED_SYRINGE | INTRAMUSCULAR | Status: DC
Start: 2023-11-22 — End: 2023-11-22
  Administered 2023-11-22: 40 mg via SUBCUTANEOUS
  Filled 2023-11-21: qty 0.4

## 2023-11-21 NOTE — ED Notes (Signed)
 Called [facility] for transport.

## 2023-11-21 NOTE — ED Triage Notes (Signed)
Patient c/o elevated blood sugar assoicated with dizziness. Denies other sx. Reports being out of his medication for "awhile" CBG 500s in triage.

## 2023-11-21 NOTE — Progress Notes (Signed)
Plan of Care Note for accepted transfer   Patient: Daniel Malone MRN: [id number]   DOA: 11/21/2023  Facility requesting transfer: [facility] Requesting Provider: Dr. Deretha Emory Reason for transfer: Hyperglycemic crisis Facility course:  Patient is a 63 year old male with history of CAD s/p CABG, T2DM, CKD stage IIIa, HTN, MELAS syndrome.  Presenting with HHS.  Glucose 630, bicarb 22, anion gap 11.  Beta hydroxybutyrate and serum osmolality in process.  UA pending collection. He has been off of his home medications for months due to financial issues.  Patient has been started on insulin infusion for hyperglycemic crisis protocol.  Admission requested for further management.  Plan of care: The patient is accepted for admission to [facility] unit, at [facility] or [facility], first available.  Author: Darreld Mclean, MD 11/21/2023  Check [email or web address] for on-call coverage.  Nursing staff, Please call [facility] Admits & Consults System-Wide number on Amion as soon as patient's arrival, so appropriate admitting provider can evaluate the pt.

## 2023-11-21 NOTE — ED Provider Notes (Addendum)
[place] EMERGENCY DEPARTMENT AT [facility] Provider Note   CSN: [id number] Arrival date & time: 11/21/23  1405     History  Chief Complaint  Patient presents with   Hyperglycemia    Daniel Malone is a 63 y.o. male.  Patient with known history of diabetes.  Patient is from the [place] area.  Patient has been out of his medications for several weeks to months.  They started checking his blood sugars as of yesterday they noticed they been running kind of in the 500 range.  Patient was on Januvia and on Glucotrol XL.  Does have a new primary care doctor to see on February 18.  Patient has been losing weight.  No nausea no vomiting no fevers.  Appetite has been down.  Patient is hard of hearing.  Wears hearing aids.  Past medical history doing for hypertension medial syndrome peripheral vascular disease diabetes type 2 hearing loss chronic renal disease stage III.  Patient is a former tobacco user.       Home Medications Prior to Admission medications   Medication Sig Start Date End Date Taking? Authorizing Provider  aspirin EC 81 MG EC tablet Take 1 tablet (81 mg total) by mouth daily. 05/04/18   Filbert Schilder, NP  glipiZIDE (GLUCOTROL XL) 10 MG 24 hr tablet Take 10 mg by mouth 2 (two) times daily.    [provider]  metoprolol tartrate (LOPRESSOR) 25 MG tablet Take 0.5 tablets (12.5 mg total) by mouth 2 (two) times daily. 05/03/18   Georgie Chard D, NP  pioglitazone (ACTOS) 30 MG tablet Take 30 mg by mouth every morning.     [provider]  sitaGLIPtin (JANUVIA) 100 MG tablet Take 100 mg by mouth every morning.     [provider]      Allergies    Patient has no known allergies.    Review of Systems   Review of Systems  Constitutional:  Positive for appetite change. Negative for chills and fever.  HENT:  Positive for hearing loss. Negative for rhinorrhea and sore throat.   Eyes:  Negative for visual disturbance.  Respiratory:   Negative for cough and shortness of breath.   Cardiovascular:  Negative for chest pain and leg swelling.  Gastrointestinal:  Negative for abdominal pain, diarrhea, nausea and vomiting.  Genitourinary:  Negative for dysuria.  Musculoskeletal:  Negative for back pain and neck pain.  Skin:  Negative for rash.  Neurological:  Negative for dizziness, light-headedness and headaches.  Hematological:  Does not bruise/bleed easily.  Psychiatric/Behavioral:  Negative for confusion.     Physical Exam Updated Vital Signs BP 108/80   Pulse 98   Temp 98 F (36.7 C) (Oral)   Resp 18   Ht 1.727 m (5\' 8" )   Wt 47.8 kg   SpO2 99%   BMI 16.02 kg/m  Physical Exam Vitals and nursing note reviewed.  Constitutional:      General: He is not in acute distress.    Appearance: Normal appearance. He is well-developed.  HENT:     Head: Normocephalic and atraumatic.     Mouth/Throat:     Mouth: Mucous membranes are moist.     Comments: Mucous membranes moist. Eyes:     Extraocular Movements: Extraocular movements intact.     Conjunctiva/sclera: Conjunctivae normal.     Pupils: Pupils are equal, round, and reactive to light.  Cardiovascular:     Rate and Rhythm: Normal rate and regular rhythm.  Heart sounds: No murmur heard. Pulmonary:     Effort: Pulmonary effort is normal. No respiratory distress.     Breath sounds: Normal breath sounds.  Abdominal:     Palpations: Abdomen is soft.     Tenderness: There is no abdominal tenderness.  Musculoskeletal:        General: No swelling.     Cervical back: Normal range of motion and neck supple.  Skin:    General: Skin is warm and dry.     Capillary Refill: Capillary refill takes less than 2 seconds.  Neurological:     General: No focal deficit present.     Mental Status: He is alert.  Psychiatric:        Mood and Affect: Mood normal.     ED Results / Procedures / Treatments   Labs (all labs ordered are listed, but only abnormal results are  displayed) Labs Reviewed  CBC WITH DIFFERENTIAL/PLATELET - Abnormal; Notable for the following components:      Result Value   HCT 38.3 (*)    All other components within normal limits  CBG MONITORING, ED - Abnormal; Notable for the following components:   Glucose-Capillary 558 (*)    All other components within normal limits  BASIC METABOLIC PANEL  BETA-HYDROXYBUTYRIC ACID  I-STAT VENOUS BLOOD GAS, ED    EKG None  Radiology No results found.  Procedures Procedures    Medications Ordered in ED Medications - No data to display  ED Course/ Medical Decision Making/ A&P                                 Medical Decision Making Amount and/or Complexity of Data Reviewed Labs: ordered. Radiology: ordered.  Risk Prescription drug management. Decision regarding hospitalization.   Blood sugar here 558.  Patient's vital signs are reassuring though temp is 98 pulse 98 respirations 18 blood pressure 108/80 oxygen saturation is 99%.  CBC white count 8.2 hemoglobin 13.6 platelets 175 patient metabolic panel and hydroxybutyrate acid and venous blood gas pending.  Hydroxybutyrate acid still pending.  Patient's i-STAT venous blood gas reassuring with a pH 7.3.  Potassium on that was up a little bit at 5.4.  Better on gone basic metabolic panel potassium was 5.2.  Blood sugar on that was 630 CO2 22 renal function GFR at 57 creatinine 1.4.  Based on this patient probably will require admission for significant hyperglycemia.  Does not seem to be secondary to DKA.  Will initiate the hyperglycemic protocol.  Patient will require admission.  CRITICAL CARE Performed by: Vanetta Mulders Total critical care time: 45 minutes Critical care time was exclusive of separately billable procedures and treating other patients. Critical care was necessary to treat or prevent imminent or life-threatening deterioration. Critical care was time spent personally by me on the following activities:  development of treatment plan with patient and/or surrogate as well as nursing, discussions with consultants, evaluation of patient's response to treatment, examination of patient, obtaining history from patient or surrogate, ordering and performing treatments and interventions, ordering and review of laboratory studies, ordering and review of radiographic studies, pulse oximetry and re-evaluation of patient's condition.  Chest x-ray without any acute findings.  Final Clinical Impression(s) / ED Diagnoses Final diagnoses:  Hyperglycemia    Rx / DC Orders ED Discharge Orders     None         Vanetta Mulders, MD 11/21/23 1606  Vanetta Mulders, MD 11/21/23 1645    Vanetta Mulders, MD 11/21/23 [phone]

## 2023-11-21 NOTE — ED Notes (Signed)
Next CBG check due at 2041 per Endo tool; CBG at 1941 is 325.

## 2023-11-22 ENCOUNTER — Other Ambulatory Visit: Payer: Self-pay

## 2023-11-22 DIAGNOSIS — N1831 Chronic kidney disease, stage 3a: Secondary | ICD-10-CM

## 2023-11-22 DIAGNOSIS — E1100 Type 2 diabetes mellitus with hyperosmolarity without nonketotic hyperglycemic-hyperosmolar coma (NKHHC): Secondary | ICD-10-CM

## 2023-11-22 DIAGNOSIS — I2510 Atherosclerotic heart disease of native coronary artery without angina pectoris: Secondary | ICD-10-CM

## 2023-11-22 LAB — CBC WITH DIFFERENTIAL/PLATELET
Abs Immature Granulocytes: 0.03 10*3/uL (ref 0.00–0.07)
Basophils Absolute: 0.1 10*3/uL (ref 0.0–0.1)
Basophils Relative: 1 %
Eosinophils Absolute: 0.1 10*3/uL (ref 0.0–0.5)
Eosinophils Relative: 2 %
HCT: 37.2 % — ABNORMAL LOW (ref 39.0–52.0)
Hemoglobin: 12.9 g/dL — ABNORMAL LOW (ref 13.0–17.0)
Immature Granulocytes: 0 %
Lymphocytes Relative: 34 %
Lymphs Abs: 2.5 10*3/uL (ref 0.7–4.0)
MCH: 31 pg (ref 26.0–34.0)
MCHC: 34.7 g/dL (ref 30.0–36.0)
MCV: 89.4 fL (ref 80.0–100.0)
Monocytes Absolute: 0.6 10*3/uL (ref 0.1–1.0)
Monocytes Relative: 8 %
Neutro Abs: 4.1 10*3/uL (ref 1.7–7.7)
Neutrophils Relative %: 55 %
Platelets: 180 10*3/uL (ref 150–400)
RBC: 4.16 MIL/uL — ABNORMAL LOW (ref 4.22–5.81)
RDW: 12 % (ref 11.5–15.5)
WBC: 7.4 10*3/uL (ref 4.0–10.5)
nRBC: 0 % (ref 0.0–0.2)

## 2023-11-22 LAB — LIPID PANEL
Cholesterol: 165 mg/dL (ref 0–200)
HDL: 59 mg/dL (ref >40)
LDL Cholesterol: 82 mg/dL (ref 0–99)
Total CHOL/HDL Ratio: 2.8 {ratio}
Triglycerides: 121 mg/dL (ref <150)
VLDL: 24 mg/dL (ref 0–40)

## 2023-11-22 LAB — BASIC METABOLIC PANEL
Anion gap: 8 (ref 5–15)
BUN: 28 mg/dL — ABNORMAL HIGH (ref 8–23)
CO2: 24 mmol/L (ref 22–32)
Calcium: 9.1 mg/dL (ref 8.9–10.3)
Chloride: 102 mmol/L (ref 98–111)
Creatinine, Ser: 0.85 mg/dL (ref 0.61–1.24)
GFR, Estimated: >60 mL/min (ref >60)
Glucose, Bld: 170 mg/dL — ABNORMAL HIGH (ref 70–99)
Potassium: 3.7 mmol/L (ref 3.5–5.1)
Sodium: 134 mmol/L — ABNORMAL LOW (ref 135–145)

## 2023-11-22 LAB — GLUCOSE, CAPILLARY
Glucose-Capillary: 90 mg/dL (ref 70–99)
Glucose-Capillary: 121 mg/dL — ABNORMAL HIGH (ref 70–99)

## 2023-11-22 LAB — HIV ANTIBODY (ROUTINE TESTING W REFLEX): HIV Screen 4th Generation wRfx: NONREACTIVE

## 2023-11-22 LAB — MRSA NEXT GEN BY PCR, NASAL: MRSA by PCR Next Gen: NOT DETECTED

## 2023-11-22 MED ORDER — BLOOD GLUCOSE TEST VI STRP: 1.0000 | ORAL_STRIP | Freq: Three times a day (TID) | 0 refills | Status: AC

## 2023-11-22 MED ORDER — BASAGLAR KWIKPEN 100 UNIT/ML ~~LOC~~ SOPN: 10.0000 [IU] | PEN_INJECTOR | Freq: Every day | SUBCUTANEOUS | 6 refills | Status: AC

## 2023-11-22 MED ORDER — LANCETS MISC. MISC: 1.0000 | Freq: Three times a day (TID) | 0 refills | Status: DC

## 2023-11-22 MED ORDER — PEN NEEDLES 31G X 5 MM MISC: 1.0000 | Freq: Three times a day (TID) | 0 refills | Status: AC

## 2023-11-22 MED ORDER — LANCETS MISC: 1.0000 | Freq: Three times a day (TID) | 0 refills | Status: AC

## 2023-11-22 MED ORDER — BLOOD GLUCOSE TEST VI STRP: 1.0000 | ORAL_STRIP | Freq: Three times a day (TID) | 0 refills | Status: DC

## 2023-11-22 MED ORDER — PEN NEEDLES 31G X 5 MM MISC: 1.0000 | Freq: Three times a day (TID) | 0 refills | Status: DC

## 2023-11-22 MED ORDER — LANCET DEVICE MISC: 1.0000 | Freq: Three times a day (TID) | 0 refills | Status: DC

## 2023-11-22 MED ORDER — BLOOD GLUCOSE MONITORING SUPPL DEVI: 1.0000 | Freq: Three times a day (TID) | 0 refills | Status: AC

## 2023-11-22 MED ORDER — LIVING WELL WITH DIABETES BOOK
Freq: Once | Status: AC
  Filled 2023-11-22: qty 1

## 2023-11-22 MED ORDER — BASAGLAR KWIKPEN 100 UNIT/ML ~~LOC~~ SOPN: 10.0000 [IU] | PEN_INJECTOR | Freq: Every day | SUBCUTANEOUS | 6 refills | Status: DC

## 2023-11-22 MED ORDER — LANCET DEVICE MISC: 1.0000 | Freq: Three times a day (TID) | 0 refills | Status: AC

## 2023-11-22 MED ORDER — INSULIN STARTER KIT- PEN NEEDLES (ENGLISH)
1.0000 | Freq: Once | Status: AC
Start: 2023-11-22 — End: 2023-11-22
  Administered 2023-11-22: 1
  Filled 2023-11-22: qty 1

## 2023-11-22 MED ORDER — BLOOD GLUCOSE MONITORING SUPPL DEVI: 1.0000 | Freq: Three times a day (TID) | 0 refills | Status: DC

## 2023-11-22 MED ORDER — BAQSIMI TWO PACK 3 MG/DOSE NA POWD: 3.0000 mg | NASAL | 0 refills | Status: AC | PRN

## 2023-11-22 NOTE — H&P (Addendum)
History and Physical    Daniel Malone ZOX:[id number] DOB: [birth year] DOA: 11/21/2023  Patient coming from: Home.  Chief Complaint: Elevated blood sugar.  HPI: Daniel Malone is a 63 y.o. male with history of CAD status post CABG in 2019 complicated with pericardial effusion requiring pericardial window in 2019 shortly after the CABG with history of chronic kidney disease stage III, chronic anemia, diabetes mellitus type 2 has been having increased urination and elevated blood sugars last few weeks.  Has not taken his antidiabetic medications for almost 5 to 6 months now.  Has an appointment with patient's primary care physician next week but due to worsening symptoms elevated blood sugar fatigue and weight loss patient presents to the ER.  Denies any chest pain or shortness of breath.  ED Course: In the ER patient's blood sugar was 630 with anion gap of 11 with venous blood gas showing pH of 7.31 urine showed negative for ketones.  Beta hydroxybutyrate acid was 0.43 patient was started on fluid bolus and IV insulin admitted for hyperosmolar status uncontrolled diabetes.  By the time I was examining the patient the patient's blood sugar has come down to 144.  Review of Systems: As per HPI, rest all negative.   Past Medical History:  Diagnosis Date   Chronic renal disease, stage 3, moderately decreased glomerular filtration rate (GFR) between 30-59 mL/min/1.73 square meter (HCC) 03/10/2018   Diabetes mellitus, type 2 (HCC) 02/23/2018   Hearing loss 02/23/2018   Hypertension 02/23/2018   MELAS syndrome (HCC) 02/23/2018   Peripheral vascular disease (HCC) 02/23/2018    Past Surgical History:  Procedure Laterality Date   CORONARY ARTERY BYPASS GRAFT N/A 03/12/2018   Procedure: CORONARY ARTERY BYPASS GRAFTING (CABG) x 4 WITH ENODOSCOPIC HARVESTING OF RIGHT SAPHENOUS VEIN.  LIMA TO LAD, SVG TO DIAGONAL, SEQUENTIAL SVG TO INTERMEDIUS RAMUS AND DISTAL CIRCUMFLEX;  Surgeon: [name], MD;  Location: [facility]  OR;  Service: Open Heart Surgery;  Laterality: N/A;   LEFT HEART CATH AND CORONARY ANGIOGRAPHY N/A 03/10/2018   Procedure: LEFT HEART CATH AND CORONARY ANGIOGRAPHY;  Surgeon: Corky Crafts, MD;  Location: [facility] INVASIVE CV LAB;  Service: Cardiovascular;  Laterality: N/A;   NASAL SINUS SURGERY     SUBXYPHOID PERICARDIAL WINDOW N/A 04/30/2018   Procedure: SUBXYPHOID PERICARDIAL WINDOW WITH DRAINAGE OF PERICARDIAL EFFUSION;  Surgeon: [name], MD;  Location: [facility] OR;  Service: Thoracic;  Laterality: N/A;   TEE WITHOUT CARDIOVERSION N/A 03/12/2018   Procedure: TRANSESOPHAGEAL ECHOCARDIOGRAM (TEE);  Surgeon: [name], MD;  Location: [facility] OR;  Service: Open Heart Surgery;  Laterality: N/A;   TEE WITHOUT CARDIOVERSION N/A 04/30/2018   Procedure: TRANSESOPHAGEAL ECHOCARDIOGRAM (TEE);  Surgeon: [name], MD;  Location: [facility] OR;  Service: Thoracic;  Laterality: N/A;     reports that he has never smoked. He has quit using smokeless tobacco.  His smokeless tobacco use included chew. He reports that he does not currently use alcohol. He reports that he does not currently use drugs.  No Known Allergies  Family History  Problem Relation Age of Onset   Diabetes Mother    CAD Father     Prior to Admission medications   Medication Sig Start Date End Date Taking? Authorizing Provider  aspirin EC 81 MG EC tablet Take 1 tablet (81 mg total) by mouth daily. Patient taking differently: Take 81 mg by mouth 3 (three) times a week. 05/04/18  Yes Georgie Chard D, NP  metoprolol tartrate (LOPRESSOR) 25 MG tablet  Take 0.5 tablets (12.5 mg total) by mouth 2 (two) times daily. 05/03/18  Yes Filbert Schilder, NP    Physical Exam: Constitutional: Moderately built and nourished. Vitals:   11/21/23 2100 11/21/23 2200 11/21/23 2300 11/22/23 0000  BP: 126/78 110/61 (!) 106/55 (!) 100/58  Pulse: 91 88 84 75  Resp: 16 14 12 11   Temp: 98.3 F (36.8 C)   98.3 F (36.8 C)  TempSrc:    Oral   SpO2: 98% 98% 97% 97%  Weight:      Height:       Eyes: Anicteric no pallor. ENMT: No discharge from the ears eyes nose or mouth. Neck: No mass felt.  No neck rigidity. Respiratory: No rhonchi or crepitations. Cardiovascular: S1-S2 heard. Abdomen: Soft nontender bowel sounds present. Musculoskeletal: No edema. Skin: No rash. Neurologic: Alert awake oriented to time place and person.  Moves all extremities. Psychiatric: Appears normal.  Normal affect.   Labs on Admission: I have personally reviewed following labs and imaging studies  CBC: Recent Labs  Lab 11/21/23 1528 11/21/23 1559  WBC 8.2  --   NEUTROABS 6.3  --   HGB 13.6 14.3  HCT 38.3* 42.0  MCV 87.6  --   PLT 175  --    Basic Metabolic Panel: Recent Labs  Lab 11/21/23 1528 11/21/23 1559 11/21/23 1820  NA 123* 125* 131*  K 5.2* 5.4* 3.6  CL 90*  --  102  CO2 22  --  20*  GLUCOSE 630*  --  407*  BUN 30*  --  26*  CREATININE 1.40*  --  1.00  CALCIUM 9.4  --  7.7*   GFR: Estimated Creatinine Clearance: 54.5 mL/min (by C-G formula based on SCr of 1 mg/dL). Liver Function Tests: No results for input(s): "AST", "ALT", "ALKPHOS", "BILITOT", "PROT", "ALBUMIN" in the last 168 hours. No results for input(s): "LIPASE", "AMYLASE" in the last 168 hours. No results for input(s): "AMMONIA" in the last 168 hours. Coagulation Profile: No results for input(s): "INR", "PROTIME" in the last 168 hours. Cardiac Enzymes: No results for input(s): "CKTOTAL", "CKMB", "CKMBINDEX", "TROPONINI" in the last 168 hours. BNP (last 3 results) No results for input(s): "PROBNP" in the last 8760 hours. HbA1C: No results for input(s): "HGBA1C" in the last 72 hours. CBG: Recent Labs  Lab 11/21/23 1458 11/21/23 1819 11/21/23 1939 11/21/23 2040 11/21/23 2156  GLUCAP 558* 387* 325* 177* 144*   Lipid Profile: No results for input(s): "CHOL", "HDL", "LDLCALC", "TRIG", "CHOLHDL", "LDLDIRECT" in the last 72 hours. Thyroid Function  Tests: No results for input(s): "TSH", "T4TOTAL", "FREET4", "T3FREE", "THYROIDAB" in the last 72 hours. Anemia Panel: No results for input(s): "VITAMINB12", "FOLATE", "FERRITIN", "TIBC", "IRON", "RETICCTPCT" in the last 72 hours. Urine analysis:    Component Value Date/Time   COLORURINE YELLOW 11/21/2023 1919   APPEARANCEUR CLEAR 11/21/2023 1919   LABSPEC 1.010 11/21/2023 1919   PHURINE 5.5 11/21/2023 1919   GLUCOSEU >=500 (A) 11/21/2023 1919   HGBUR NEGATIVE 11/21/2023 1919   BILIRUBINUR NEGATIVE 11/21/2023 1919   KETONESUR NEGATIVE 11/21/2023 1919   PROTEINUR 100 (A) 11/21/2023 1919   NITRITE NEGATIVE 11/21/2023 1919   LEUKOCYTESUR NEGATIVE 11/21/2023 1919   Sepsis Labs: @LABRCNTIP (procalcitonin:4,lacticidven:4) )No results found for this or any previous visit (from the past 240 hours).   Radiological Exams on Admission: DG Chest Port 1 View Result Date: 11/21/2023 CLINICAL DATA:  Hyperglycemia and dizziness EXAM: PORTABLE CHEST 1 VIEW COMPARISON:  05/13/2018 FINDINGS: Atherosclerotic calcification of the aortic arch. Prior CABG.  The lungs appear clear. No blunting of the costophrenic angles. No significant bony findings. IMPRESSION: 1. No acute findings. 2. Prior CABG. 3. Aortic Atherosclerosis (ICD10-I70.0). Electronically Signed   By: Gaylyn Rong M.D.   On: 11/21/2023 17:10    EKG: Independently reviewed.  Normal sinus rhythm.  Assessment/Plan Principal Problem:   Type 2 diabetes mellitus with hyperosmolar hyperglycemic state (HHS) (HCC) Active Problems:   Hearing loss   MELAS syndrome (HCC)   Essential hypertension   Chronic renal disease, stage 3, moderately decreased glomerular filtration rate (GFR) between 30-59 mL/min/1.73 square meter (HCC)   S/P CABG x 4   CAD (coronary artery disease)   Hyponatremia    Type 2 diabetes mellitus with hyperosmolar hyperglycemic state due to patient being noncompliant with his antidiabetic medications.  Patient usually takes  oral hypoglycemics previously.  Presently on IV insulin infusion and at the time of my exam patient blood sugars have come down to 144.  At this time planning to discontinue the insulin infusion and keep patient on insulin glargine 10 units with sliding scale coverage.  Hemoglobin A1c still pending. CAD status post CABG in 2019 denies any chest pain.  Patient takes beta-blocker and aspirin.  Check lipid panel. Chronic kidney disease stage III creatinine at around baseline. Anemia likely from renal disease.  Check anemia panel. History of MELAS syndrome and hard of hearing.  Since patient has hyperosmolar status with uncontrolled diabetes will need close monitoring and more than 2 midnight stay.   DVT prophylaxis: Lovenox. Code Status: Full code. Family Communication: Discussed with patient. Disposition Plan: Stepdown. Consults called: None. Admission status: Observation.

## 2023-11-22 NOTE — Inpatient Diabetes Management (Addendum)
Inpatient Diabetes Program Recommendations  AACE/ADA: New Consensus Statement on Inpatient Glycemic Control (2015)  Target Ranges:  Prepandial:   less than 140 mg/dL      Peak postprandial:   less than 180 mg/dL (1-2 hours)      Critically ill patients:  140 - 180 mg/dL   Lab Results  Component Value Date   GLUCAP 90 11/22/2023   HGBA1C 7.1 (H) 03/11/2018  Spoke with patient's friend who communicated with patient (states patient's hearing aid not working well enough to hear). Explained patient has need for insulin for glycemic control. Patient prefers to only be on insulin, but his friend states he and patient's sister will be able to help him give an insulin injection once daily. Sent video of insulin injection via pen to patient and sister's phone for review. Ordered Living Well With diabetes and insulin pen starter kit for patient to review and RN to review with patient. TOC gave patient MATCH letter for medications.   Discharge Recommendations: Long acting recommendations: Insulin Glargine (BASAGLAR) Kwikpen 10 units daily. Call MD for dose changes if fasting am is <90.  Hypoglycemia treatment recommendations: Baqsimi 3mg  Supply/Referral recommendations: Glucometer Test strips Lancet device Pen needles - standard   Use Adult Diabetes Insulin Treatment Post Discharge order set.  Thank you, Daniel Malone. Gordana Kewley, RN, MSN, CDCES  Diabetes Coordinator Inpatient Glycemic Control Team Team Pager [phone] (8am-5pm) 11/22/2023 10:04 AM

## 2023-11-22 NOTE — Discharge Summary (Signed)
Physician Discharge Summary  Macintyre Alexa FIE:[id number] DOB: [birth year] DOA: 11/21/2023  PCP: Nonnie Done., MD  Admit date: 11/21/2023 Discharge date: 11/22/2023  Admitted From: Home Disposition: Home  Recommendations for Outpatient Follow-up:  Follow up with PCP in 1-2 weeks Please obtain BMP/CBC in one week  Home Health: None Equipment/Devices: None  Discharge Condition: Stable CODE STATUS: Full Diet recommendation: Cardiac  Brief/Interim Summary:   63 y.o. male with history of CAD status post CABG in 2019 complicated with pericardial effusion requiring pericardial window in 2019 shortly after the CABG with history of chronic kidney disease stage III, chronic anemia, diabetes mellitus type 2 has been having increased urination and elevated blood sugars last few weeks.  Has not taken his antidiabetic medications for almost 5 to 6 months now.  Has an appointment with patient's primary care physician next week but due to worsening symptoms elevated blood sugar fatigue and weight loss patient presents to the ER.  Denies any chest pain or shortness of breath.   ED Course: In the ER patient's blood sugar was 630 with anion gap of 11 with venous blood gas showing pH of 7.31 urine showed negative for ketones.  Beta hydroxybutyrate acid was 0.43 patient was started on fluid bolus and IV insulin admitted for hyperosmolar status uncontrolled diabetes.  By the time I was examining the patient the patient's blood sugar has come down to 144.  Discharge Diagnoses:  Principal Problem:   Type 2 diabetes mellitus with hyperosmolar hyperglycemic state (HHS) (HCC) Active Problems:   Hearing loss   MELAS syndrome (HCC)   Essential hypertension   Chronic renal disease, stage 3, moderately decreased glomerular filtration rate (GFR) between 30-59 mL/min/1.73 square meter (HCC)   S/P CABG x 4   CAD (coronary artery disease)   Hyponatremia    Type 2 diabetes mellitus with hyperosmolar hyperglycemic  state due to patient being noncompliant with his antidiabetic medications.  Patient usually takes oral hypoglycemics previously.  He was briefly on insulin drip with improvement in his blood sugar to 144.  He was discharged on Basaglar 10 units at bedtime.  Hemoglobin A1c still pending at the time of discharge.  His brother/friend was willing to give him the shot.  He will follow-up with his primary doctor.   CAD status post CABG in 2019 continue aspirin and metoprolol.   Chronic kidney disease stage III creatinine at around baseline. Anemia likely from renal disease.   History of MELAS syndrome and hard of hearing.  Estimated body mass index is 16.86 kg/m as calculated from the following:   Height as of this encounter: 5\' 8"  (1.727 m).   Weight as of this encounter: 50.3 kg.  Discharge Instructions  Discharge Instructions     Diet - low sodium heart healthy   Complete by: As directed    Increase activity slowly   Complete by: As directed       Allergies as of 11/22/2023   No Known Allergies      Medication List     TAKE these medications    aspirin EC 81 MG tablet Take 1 tablet (81 mg total) by mouth daily. What changed: when to take this   Baqsimi Two Pack 3 MG/DOSE Powd Generic drug: Glucagon Place 3 mg into the nose as needed for up to 2 doses (Severe low blood sugar). Give 3 mg (one actuation) into a single nostril.   Basaglar KwikPen 100 UNIT/ML Inject 10 Units into the skin daily. May substitute as needed  per insurance.   Blood Glucose Monitoring Suppl Devi 1 each by Does not apply route 3 (three) times daily. May dispense any manufacturer covered by patient's insurance.   BLOOD GLUCOSE TEST STRIPS Strp 1 each by Does not apply route 3 (three) times daily. Use as directed to check blood sugar. May dispense any manufacturer covered by patient's insurance and fits patient's device.   Lancet Device Misc 1 each by Does not apply route 3 (three) times daily. May  dispense any manufacturer covered by patient's insurance.   Lancets Misc 1 each by Does not apply route 3 (three) times daily. Use as directed to check blood sugar. May dispense any manufacturer covered by patient's insurance and fits patient's device.   metoprolol tartrate 25 MG tablet Commonly known as: LOPRESSOR Take 0.5 tablets (12.5 mg total) by mouth 2 (two) times daily.   Pen Needles 31G X 5 MM Misc 1 each by Does not apply route 3 (three) times daily. May dispense any manufacturer covered by patient's insurance.        Follow-up Information     [name], Excell Seltzer., MD Follow up.   Specialty: Family Medicine Contact information: [address] [place] [address] [phone]                No Known Allergies  Consultations: None    Procedures/Studies: DG Chest Port 1 View Result Date: 11/21/2023 CLINICAL DATA:  Hyperglycemia and dizziness EXAM: PORTABLE CHEST 1 VIEW COMPARISON:  05/13/2018 FINDINGS: Atherosclerotic calcification of the aortic arch. Prior CABG. The lungs appear clear. No blunting of the costophrenic angles. No significant bony findings. IMPRESSION: 1. No acute findings. 2. Prior CABG. 3. Aortic Atherosclerosis (ICD10-I70.0). Electronically Signed   By: Gaylyn Rong M.D.   On: 11/21/2023 17:10   (Echo, Carotid, EGD, Colonoscopy, ERCP)    Subjective:  Sitting in bed anxious to go home Discharge Exam: Vitals:   11/22/23 1151 11/22/23 1200  BP:    Pulse:  87  Resp:  13  Temp: 98 F (36.7 C)   SpO2:  98%   Vitals:   11/22/23 0900 11/22/23 1000 11/22/23 1151 11/22/23 1200  BP: (!) 106/57 121/77    Pulse: 80 84  87  Resp:    13  Temp:   98 F (36.7 C)   TempSrc:   Oral   SpO2: 99% 98%  98%  Weight:      Height:        General: Pt is alert, awake, not in acute distress Cardiovascular: RRR, S1/S2 +, no rubs, no gallops Respiratory: CTA bilaterally, no wheezing, no rhonchi Abdominal: Soft, NT, ND, bowel sounds  + Extremities: no edema, no cyanosis    The results of significant diagnostics from this hospitalization (including imaging, microbiology, ancillary and laboratory) are listed below for reference.     Microbiology: Recent Results (from the past 240 hours)  MRSA Next Gen by PCR, Nasal     Status: None   Collection Time: 11/21/23  2:08 AM   Specimen: Nasal Mucosa; Nasal Swab  Result Value Ref Range Status   MRSA by PCR Next Gen NOT DETECTED NOT DETECTED Final    Comment: (NOTE) The GeneXpert MRSA Assay (FDA approved for NASAL specimens only), is one component of a comprehensive MRSA colonization surveillance program. It is not intended to diagnose MRSA infection nor to guide or monitor treatment for MRSA infections. Test performance is not FDA approved in patients less than 31 years old. Performed at [facility], [address] [place], [place] [address]      Labs: BNP (last 3 results) No results for input(s): "BNP" in the last 8760 hours. Basic Metabolic Panel: Recent Labs  Lab 11/21/23 1528 11/21/23 1559 11/21/23 1820 11/22/23 0247  NA 123* 125* 131* 134*  K 5.2* 5.4* 3.6 3.7  CL 90*  --  102 102  CO2 22  --  20* 24  GLUCOSE 630*  --  407* 170*  BUN 30*  --  26* 28*  CREATININE 1.40*  --  1.00 0.85  CALCIUM 9.4  --  7.7* 9.1   Liver Function Tests: No results for input(s): "AST", "ALT", "ALKPHOS", "BILITOT", "PROT", "ALBUMIN" in the last 168 hours. No results for input(s): "LIPASE", "AMYLASE" in the last 168 hours. No results for input(s): "AMMONIA" in the last 168 hours. CBC: Recent Labs  Lab 11/21/23 1528 11/21/23 1559 11/22/23 0247  WBC 8.2  --  7.4  NEUTROABS 6.3  --  4.1  HGB 13.6 14.3 12.9*  HCT 38.3* 42.0 37.2*  MCV 87.6  --  89.4  PLT 175  --  180   Cardiac Enzymes: No results for input(s): "CKTOTAL", "CKMB", "CKMBINDEX", "TROPONINI" in the last 168 hours. BNP: Invalid input(s): "POCBNP" CBG: Recent Labs  Lab  11/21/23 1939 11/21/23 2040 11/21/23 2156 11/22/23 0724 11/22/23 1151  GLUCAP 325* 177* 144* 90 121*   D-Dimer No results for input(s): "DDIMER" in the last 72 hours. Hgb A1c No results for input(s): "HGBA1C" in the last 72 hours. Lipid Profile Recent Labs    11/22/23 0247  CHOL 165  HDL 59  LDLCALC 82  TRIG 121  CHOLHDL 2.8   Thyroid function studies No results for input(s): "TSH", "T4TOTAL", "T3FREE", "THYROIDAB" in the last 72 hours.  Invalid input(s): "FREET3" Anemia work up No results for input(s): "VITAMINB12", "FOLATE", "FERRITIN", "TIBC", "IRON", "RETICCTPCT" in the last 72 hours. Urinalysis    Component Value Date/Time   COLORURINE YELLOW 11/21/2023 1919   APPEARANCEUR CLEAR 11/21/2023 1919   LABSPEC 1.010 11/21/2023 1919   PHURINE 5.5 11/21/2023 1919   GLUCOSEU >=500 (A) 11/21/2023 1919   HGBUR NEGATIVE 11/21/2023 1919   BILIRUBINUR NEGATIVE 11/21/2023 1919   KETONESUR NEGATIVE 11/21/2023 1919   PROTEINUR 100 (A) 11/21/2023 1919   NITRITE NEGATIVE 11/21/2023 1919   LEUKOCYTESUR NEGATIVE 11/21/2023 1919   Sepsis Labs Recent Labs  Lab 11/21/23 1528 11/22/23 0247  WBC 8.2 7.4   Microbiology Recent Results (from the past 240 hours)  MRSA Next Gen by PCR, Nasal     Status: None   Collection Time: 11/21/23  2:08 AM   Specimen: Nasal Mucosa; Nasal Swab  Result Value Ref Range Status   MRSA by PCR Next Gen NOT DETECTED NOT DETECTED Final    Comment: (NOTE) The GeneXpert MRSA Assay (FDA approved for NASAL specimens only), is one component of a comprehensive MRSA colonization surveillance program. It is not intended to diagnose MRSA infection nor to guide or monitor treatment for MRSA infections. Test performance is not FDA approved in patients less than 18 years old. Performed at [facility], [address], [place], [place] [address]      Time coordinating discharge: 38 minutes  SIGNED: Alwyn Ren, MD  [facility] 11/22/2023, 3:48 PM

## 2023-11-22 NOTE — TOC Initial Note (Signed)
Transition of Care [facility]) - Initial/Assessment Note    Patient Details  Name: Daniel Malone MRN: [id number] Date of Birth: [birth year]  Transition [facility]) CM/SW Contact:    Adrian Prows, RN Phone Number: 11/22/2023, 12:01 PM  Clinical Narrative:                 [facility] consulted for medication assistance; spoke w/pt and friend Janeice Robinson in room; pt says he lives at home; he plans to return at d/c; Mr Wilber Bihari will provide transportation; pt verifies he has a PCP; he also says he does not have insurance; pt says he has a difficulty paying for housing, food, and utilities, but everything is current; he denies IPV; pt says he does not have DME, HH services, or home oxygen; pt agrees to receive resources for social services, and financial assistance; Mr Livecchi said he ran out of medication months ago and he has not had the money to purchase more; explained the [facility] program to pt; he understands he will be ineligible to receive this assistance for 1 year; also explained MATCH can be used at select pharmacies only; pt verbalized understanding, and MATCH letter given; Dr Jerolyn Center notified via secure chat that hard prescriptions are needed for patient to take to pharmacy; no TOC needs.  Expected Discharge Plan: Home/Self Care Barriers to Discharge: No Barriers Identified   Patient Goals and CMS Choice Patient states their goals for this hospitalization and ongoing recovery are:: home CMS Medicare.gov Compare Post Acute Care list provided to:: Patient        Expected Discharge Plan and Services   Discharge Planning Services: CM Consult   Living arrangements for the past 2 months: Single Family Home                                      Prior Living Arrangements/Services Living arrangements for the past 2 months: Single Family Home Lives with:: Spouse Patient language and need for interpreter reviewed:: Yes Do you feel safe going back to the place where you live?: Yes       Need for Family Participation in Patient Care: Yes (Comment) Care giver support system in place?: Yes (comment) Current home services:  (n/a) Criminal Activity/Legal Involvement Pertinent to Current Situation/Hospitalization: No - Comment as needed  Activities of Daily Living   ADL Screening (condition at time of admission) Independently performs ADLs?: Yes (appropriate for developmental age) Is the patient deaf or have difficulty hearing?: Yes Does the patient have difficulty seeing, even when wearing glasses/contacts?: Yes Does the patient have difficulty concentrating, remembering, or making decisions?: No  Permission Sought/Granted Permission sought to share information with : Case Manager Permission granted to share information with : Yes, Verbal Permission Granted  Share Information with NAME: Case Manager     Permission granted to share info w Relationship: Ike Bene (sister) [phone]     Emotional Assessment Appearance:: Appears stated age Attitude/Demeanor/Rapport: Gracious Affect (typically observed): Accepting Orientation: : Oriented to Self, Oriented to Place, Oriented to  Time, Oriented to Situation Alcohol / Substance Use: Not Applicable Psych Involvement: No (comment)  Admission diagnosis:  Hyperglycemia [R73.9] Type 2 diabetes mellitus with hyperosmolar hyperglycemic state (HHS) (HCC) [E11.00] Patient Active Problem List   Diagnosis Date Noted   Type 2 diabetes mellitus with hyperosmolar hyperglycemic state (HHS) (HCC) 11/21/2023   Hyponatremia 11/21/2023   Pericardial effusion 04/29/2018   CAD (coronary artery  disease) 03/29/2018   S/P CABG x 4 03/12/2018   Chronic renal disease, stage 3, moderately decreased glomerular filtration rate (GFR) between 30-59 mL/min/1.73 square meter (HCC) 03/10/2018   Essential hypertension 03/09/2018   Diabetes mellitus due to underlying condition with unspecified complications (HCC) 03/09/2018   Hearing loss 02/23/2018    MELAS syndrome (HCC) 02/23/2018   Peripheral vascular disease (HCC) 02/23/2018   PCP:  Nonnie Done., MD Pharmacy[place]   [facility] [place]  - [address] [place] [address] Phone: [phone] Fax: [phone]     Social Drivers of Health (SDOH) Social History: SDOH Screenings   Food Insecurity: No Food Insecurity (11/22/2023)  Housing: Low Risk  (11/22/2023)  Transportation Needs: No Transportation Needs (11/22/2023)  Utilities: Not At Risk (11/22/2023)  Tobacco Use: Medium Risk (11/21/2023)   SDOH Interventions: Food Insecurity Interventions: Intervention Not Indicated, Community Resources Provided Housing Interventions: Intervention Not Indicated, Inpatient TOC Transportation Interventions: Intervention Not Indicated, Inpatient TOC Utilities Interventions: Intervention Not Indicated, Inpatient TOC   Readmission Risk Interventions     No data to display

## 2023-11-22 NOTE — TOC Progression Note (Signed)
Transition of Care [facility]) - Progression Note    Patient Details  Name: Daniel Malone MRN: [id number] Date of Birth: 1961-06-04  Transition of Care [facility]) CM/SW Contact  Adrian Prows, RN Phone Number: 11/22/2023, 11:55 AM  Clinical Narrative:      MATCH MEDICATION ASSISTANCE CARD Pharmacies please call [phone] for claim processing assistance.  Rx BIN: [id number] Rx Group: [id number] Rx PCN: [id number] Relationship Code: 1 Person Code: [id number]  Patient ID (MRN): [id number]    Patient Name: Daniel Malone, Daniel Malone   Patient DOB: 03-02-1961   Discharge Date: 11/22/2023  Expiration Date: 11/29/2023 (must be filled within 7 days of discharge)   Dear Mr Barclay Lennox You have been approved to have the prescriptions written by your discharging physician filled through our [facility] (Medication Assistance Through [facility]) program. This program allows for a one-time (no refills) 34-day supply of selected medications for a low copay amount.  The copay is $3.00 per prescription. For instance, if you have one prescription, you will pay $3.00; for two prescriptions, you pay $6.00; for three prescriptions, you pay $9.00; and so on. Only certain pharmacies are participating in this program with [facility] You will need to select one of the pharmacies from the attached lists and take your prescriptions, this letter, and your photo ID to one of the participating pharmacies.  We are excited that you are able to use the [facility] program to get your medications. These prescriptions must be filled within 7 days of hospital discharge or they will no longer be valid for the [facility] program. Should you have any problems with your prescriptions please contact your case management team member at [phone] for [place] or [phone] for [facility].  Expected Discharge Plan: Home/Self Care Barriers to Discharge: No Barriers Identified  Expected Discharge Plan and Services   Discharge Planning  Services: CM Consult   Living arrangements for the past 2 months: Single Family Home                                       Social Determinants of Health (SDOH) Interventions SDOH Screenings   Food Insecurity: No Food Insecurity (11/22/2023)  Housing: Low Risk  (11/22/2023)  Transportation Needs: No Transportation Needs (11/22/2023)  Utilities: Not At Risk (11/22/2023)  Tobacco Use: Medium Risk (11/21/2023)    Readmission Risk Interventions     No data to display

## 2023-11-23 LAB — HEMOGLOBIN A1C
Hgb A1c MFr Bld: >15.5 % — ABNORMAL HIGH (ref 4.8–5.6)
Mean Plasma Glucose: >398 mg/dL
